# Patient Record
Sex: Male | Born: 1980 | Race: White | Hispanic: No | Marital: Married | Smoking: Never smoker
Health system: Southern US, Community
[De-identification: ages and names within clinical notes are randomized; demographics above are authoritative.]

## PROBLEM LIST (undated history)

## (undated) DIAGNOSIS — IMO0001 Reserved for inherently not codable concepts without codable children: Secondary | ICD-10-CM

## (undated) DIAGNOSIS — R030 Elevated blood-pressure reading, without diagnosis of hypertension: Secondary | ICD-10-CM

## (undated) HISTORY — DX: Reserved for inherently not codable concepts without codable children: IMO0001

## (undated) HISTORY — DX: Elevated blood-pressure reading, without diagnosis of hypertension: R03.0

---
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
## 2006-09-23 DIAGNOSIS — K3189 Other diseases of stomach and duodenum: Secondary | ICD-10-CM | POA: Insufficient documentation

## 2007-08-26 ENCOUNTER — Ambulatory Visit: Payer: Self-pay | Admitting: Otolaryngology

## 2008-01-18 DIAGNOSIS — IMO0002 Reserved for concepts with insufficient information to code with codable children: Secondary | ICD-10-CM | POA: Insufficient documentation

## 2009-11-29 IMAGING — RF DG BARIUM SWALLOW
1 series · 15 of 24 positions shown · non-contrast
Comparison: None

REASON FOR EXAM: with tablet dysphagia with solids
COMMENTS:

PROCEDURE:     FL  - FL BARIUM SWALLOW  - August 26, 2007  [DATE]
RESULT:     Barium swallow study
INDICATION: 26-year-old male with dysphagia
TECHNIQUE: Multiple single and double-contrast images of the esophagus and
stomach were obtained under fluoroscopic guidance.

[Series 1: run · 15 of 25 slices shown]
[im 1/25]
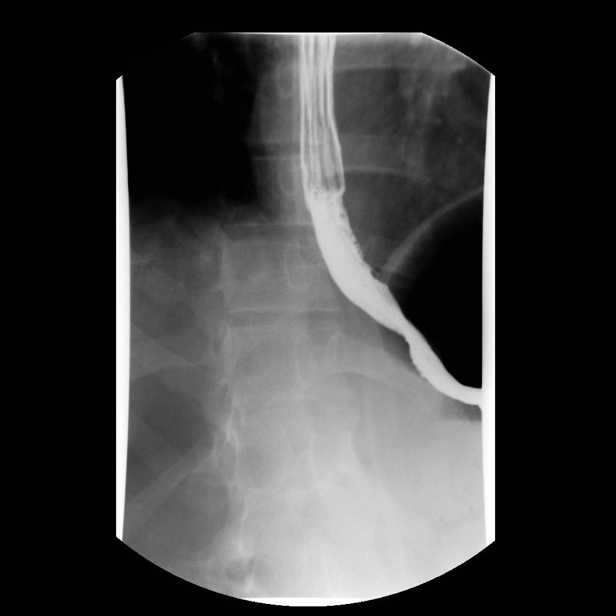
[im 3/25]
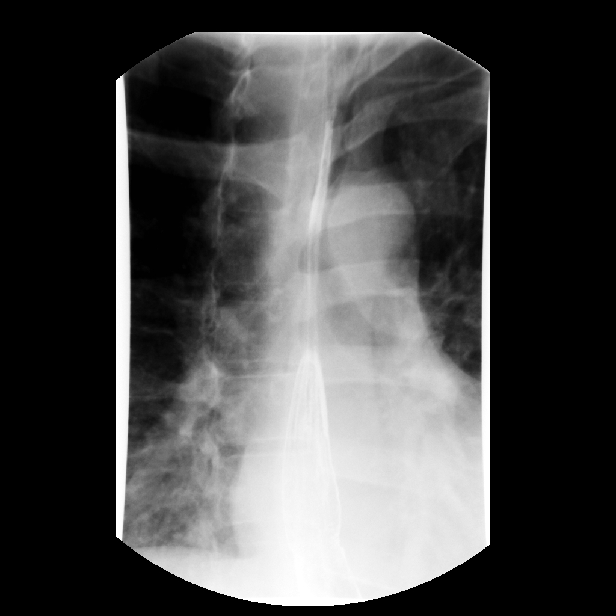
[im 5/25]
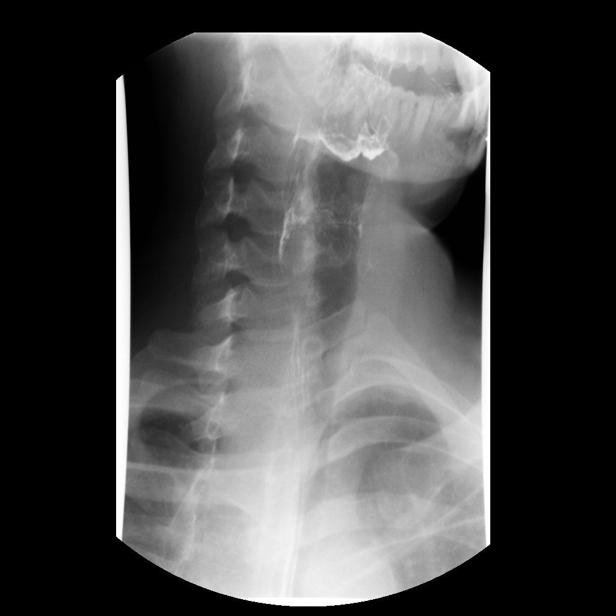
[im 6/25]
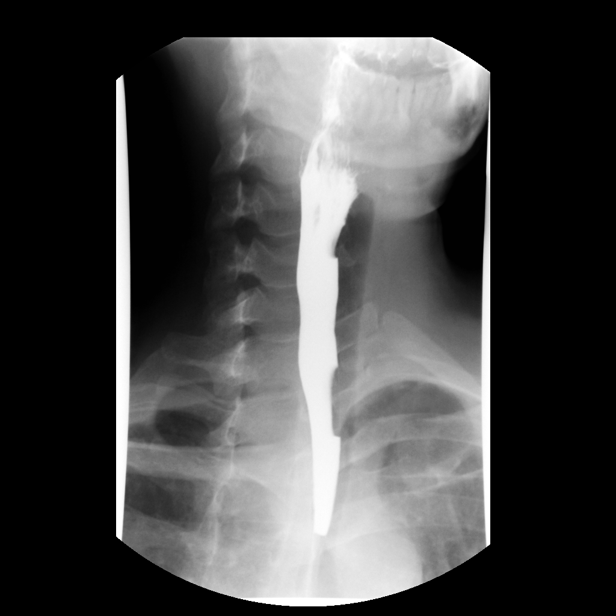
[im 8/25]
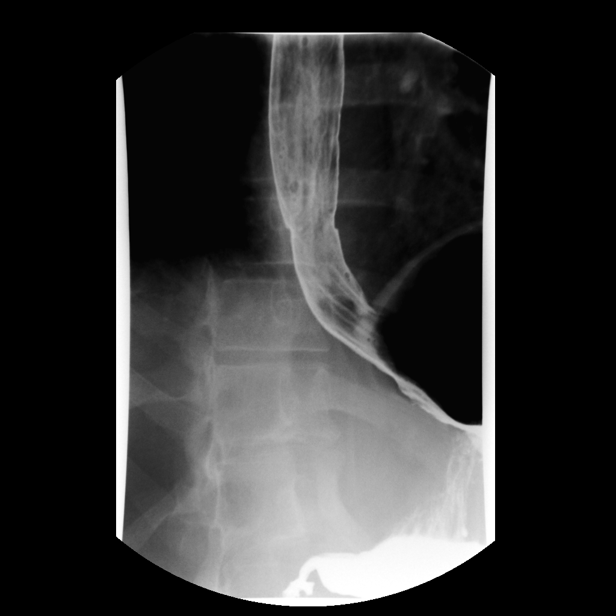
[im 9/25]
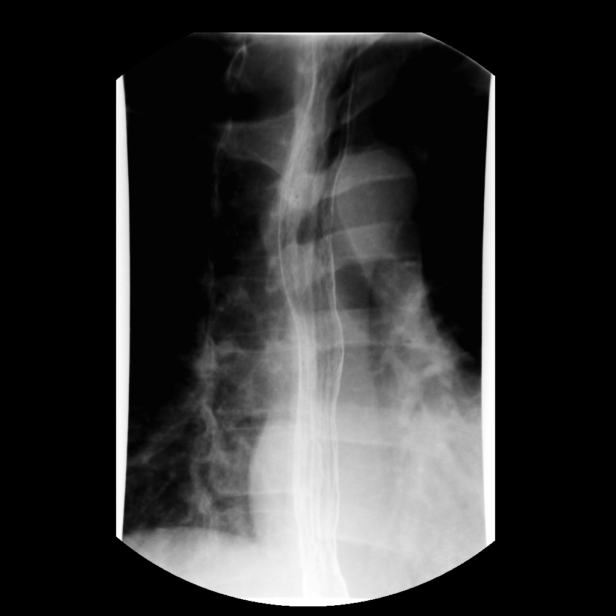
[im 11/25]
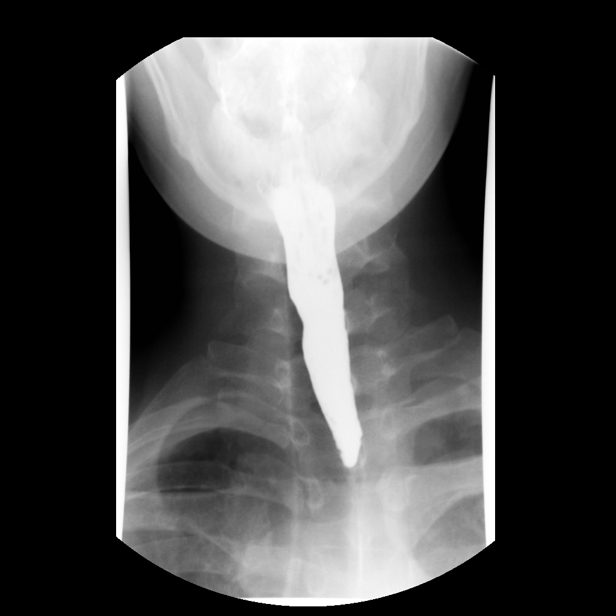
[im 13/25]
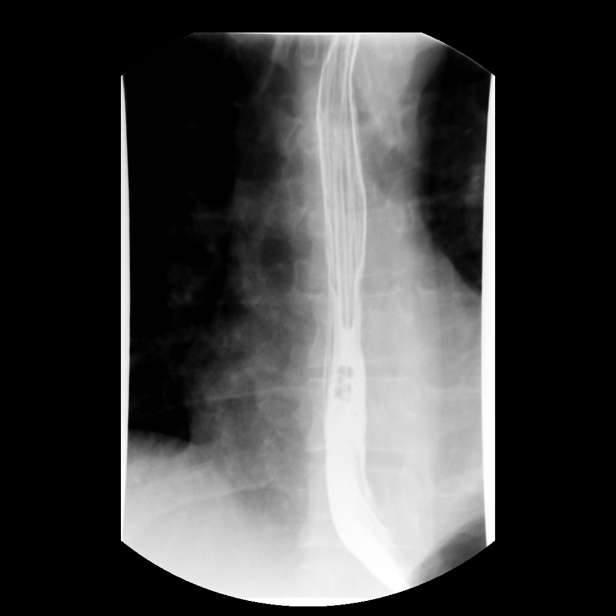
[im 14/25]
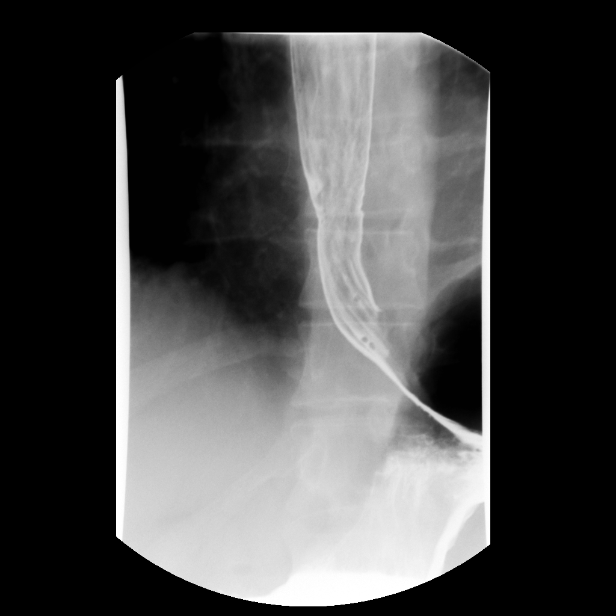
[im 16/25]
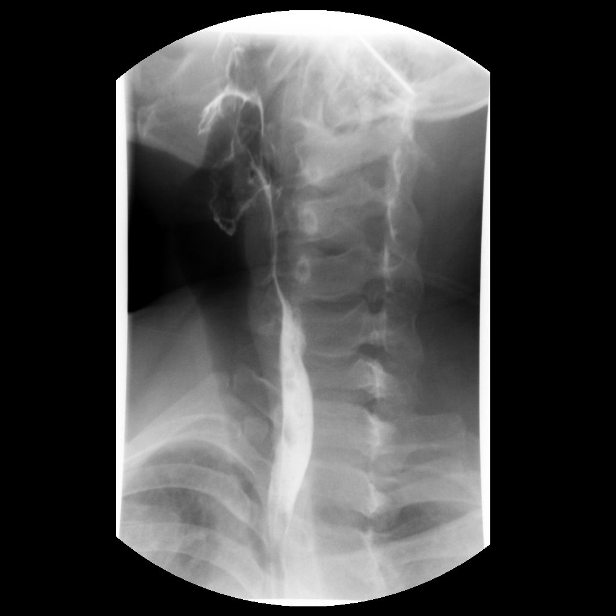
[im 17/25]
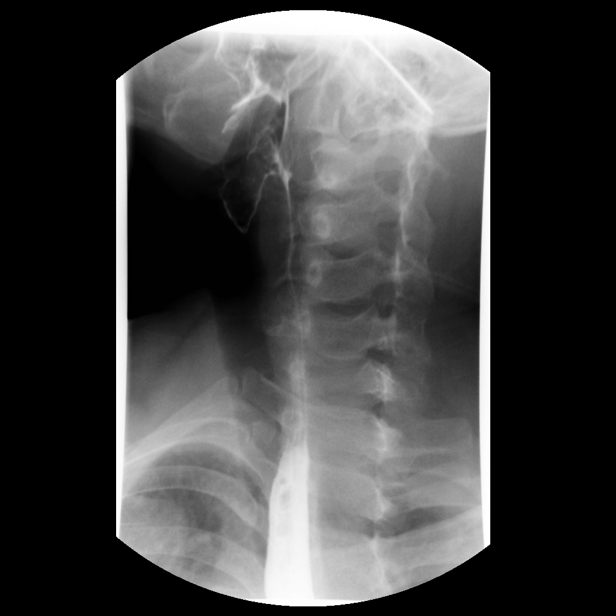
[im 19/25]
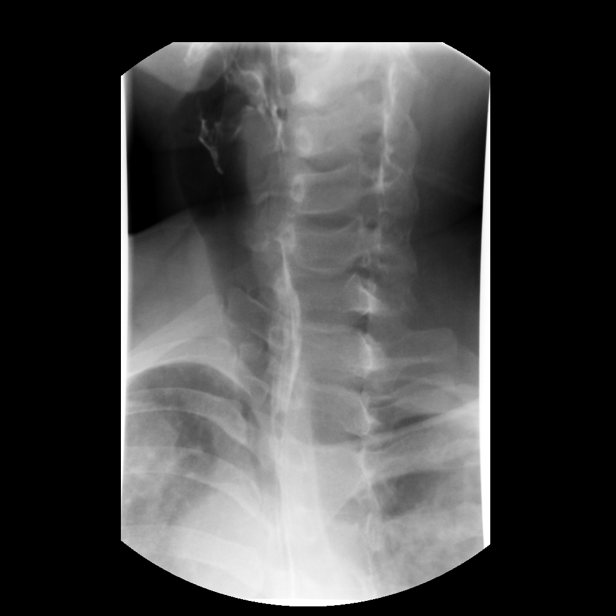
[im 21/25]
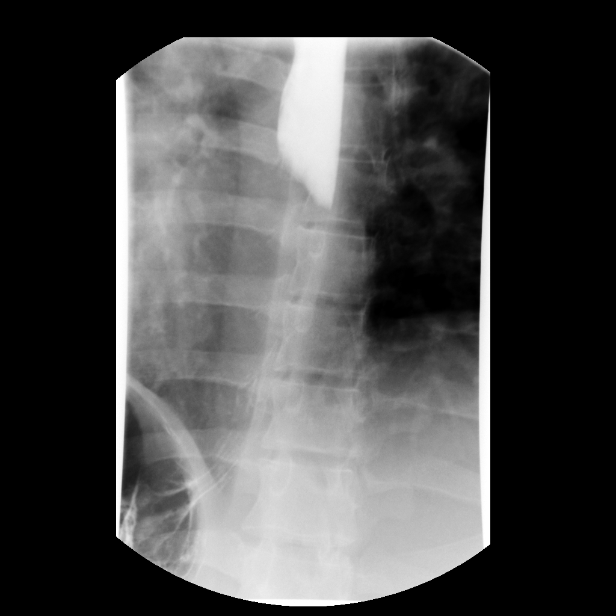
[im 22/25]
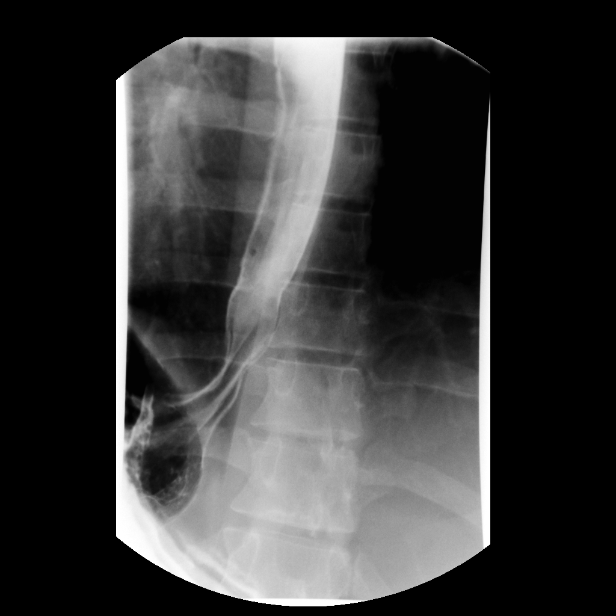
[im 25/25]
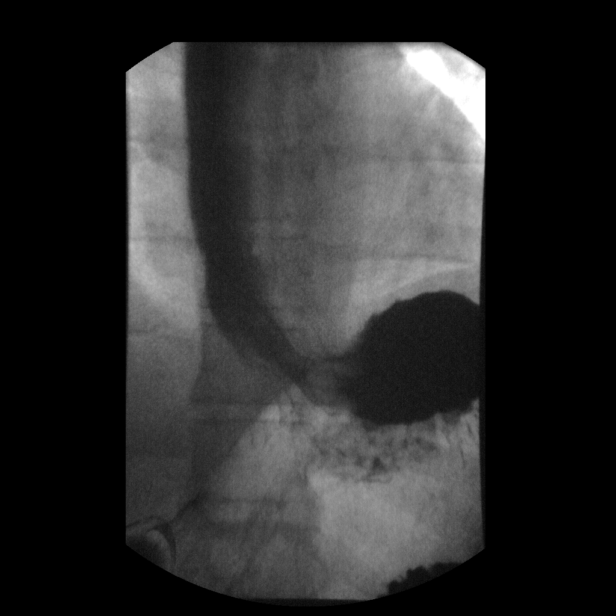

[15 of 24 positions shown; findings below may reference images not displayed]

FINDINGS: There was normal pharyngeal anatomy and motility. Contrast flowed
freely through the esophagus without evidence of stricture or mass. There
was normal esophageal mucosa without evidence of irregularity or ulceration.
 Esophageal motility was normal with evidence of gastroesophageal reflux to
the midesophagus. No definite hiatal hernia was demonstrated. The stomach
and duodenal bulb were normal.

A 12.5 mm barium tablet was administered which traversed the esophagus into
the stomach without delay.
IMPRESSION: 1. Normal esophageal motility with spontaneous gastroesophageal reflux to
the level of the midesophagus.

I have reviewed the films and concur with the above findings

## 2012-02-19 HISTORY — PX: VASECTOMY: SHX75

## 2013-10-01 ENCOUNTER — Ambulatory Visit: Payer: Self-pay | Admitting: Family Medicine

## 2014-12-23 ENCOUNTER — Ambulatory Visit (INDEPENDENT_AMBULATORY_CARE_PROVIDER_SITE_OTHER): Payer: BLUE CROSS/BLUE SHIELD | Admitting: Family Medicine

## 2014-12-23 ENCOUNTER — Encounter: Payer: Self-pay | Admitting: Family Medicine

## 2014-12-23 VITALS — BP 130/98 | HR 80 | Temp 97.8°F | Resp 16 | Ht 73.0 in | Wt 218.6 lb

## 2014-12-23 DIAGNOSIS — I10 Essential (primary) hypertension: Secondary | ICD-10-CM | POA: Diagnosis not present

## 2014-12-23 DIAGNOSIS — J302 Other seasonal allergic rhinitis: Secondary | ICD-10-CM | POA: Insufficient documentation

## 2014-12-23 DIAGNOSIS — Z23 Encounter for immunization: Secondary | ICD-10-CM | POA: Diagnosis not present

## 2014-12-23 DIAGNOSIS — R519 Headache, unspecified: Secondary | ICD-10-CM | POA: Insufficient documentation

## 2014-12-23 DIAGNOSIS — R51 Headache: Secondary | ICD-10-CM

## 2014-12-23 MED ORDER — METOPROLOL SUCCINATE ER 25 MG PO TB24: 25.0000 mg | ORAL_TABLET | Freq: Every day | ORAL | Status: DC

## 2014-12-23 NOTE — Progress Notes (Signed)
Patient: Matthew York Male    DOB: Jan 31, 1981   34 y.o.   MRN: [id number] Visit Date: 12/23/2014  Today's Provider: Vernie Murders, PA   Chief Complaint  Patient presents with  . Hypertension   Subjective:    HPI Hypertension: Patient is here for evaluation of elevated blood pressure. Last office visit was at 134/98 on 06/10/14.Patient last office visit for elevated blood pressure was on 10/01/13 and was asked to continue on Metoprolol. Patient was put on Metoprolol Succinate ER 25 mg on 12/01/12 Per patient doesn't remember the last time he took the medicine.Cardiac symptoms none.Cardiovascular risk factors: none. Use of agents associated with hypertension: none. History of target organ damage: none. Patient went today for evaluation to the [facility] to get a stress test and was told to come to his PCP for evaluation on his blood pressure. Blood Pressure reading was 144/80 before the test and after the test was 150/100.  Patient Active Problem List   Diagnosis Date Noted  . Allergic rhinitis, seasonal 12/23/2014  . Headache disorder 12/23/2014  . Sprain and strain of shoulder and upper arm 01/18/2008  . 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 Vasectomy  2014   History reviewed. No pertinent family history.  No Known Allergies Previous Medications   No medications on file   Review of Systems  Constitutional: Negative.   HENT: Negative.   Eyes: Negative.   Cardiovascular: Negative for chest pain, palpitations and leg swelling.  Gastrointestinal: Negative.   Genitourinary: Negative.   Neurological: Negative.    Social History  Substance Use Topics  . Smoking status: Never Smoker   . Smokeless tobacco: Never Used  . Alcohol Use: Yes     Comment: Occasional use   Objective:   BP 130/98 mmHg  Pulse 80  Temp(Src) 97.8 F (36.6 C) (Oral)  Resp 16  Wt 218 lb 9.6 oz (99.156 kg)  Physical  Exam  Constitutional: He is oriented to person, place, and time. He appears well-developed and well-nourished.  HENT:  Head: Normocephalic and atraumatic.  Right Ear: External ear normal.  Left Ear: External ear normal.  Nose: Nose normal.  Mouth/Throat: Oropharynx is clear and moist.  Eyes: Conjunctivae and EOM are normal.  Neck: Normal range of motion. Neck supple.  Cardiovascular: Normal rate, regular rhythm and normal heart sounds.   Pulmonary/Chest: Effort normal and breath sounds normal.  Abdominal: Soft. Bowel sounds are normal.  Musculoskeletal: Normal range of motion.  Neurological: He is alert and oriented to person, place, and time.  Skin: No rash noted.  Psychiatric: He has a normal mood and affect. His behavior is normal. Thought content normal.      Assessment & Plan:     1. Essential hypertension Recently recognized hypertension not controlled. Was on Metoprolol in 2014 but stopped taking the medications. Recheck evaluation by a health screening, in preparation for an exercise program, found BP elevated above 144/100. Will get routine labs and restart the Metoprolol Succinate 25 mg qd. Recommend sodium restricted low fat diet and proceed with walking for exercise until BP better controlled. Recheck BP in 1 month (sooner pending lab reports). - COMPLETE METABOLIC PANEL WITH GFR - CBC with Differential/Platelet - Lipid panel - TSH - metoprolol succinate (TOPROL-XL) 25 MG 24 hr tablet; Take 1 tablet (25 mg total) by mouth daily.  Dispense: 30 tablet; Refill: 3  2. Need for influenza vaccination -  Flu Vaccine QUAD 36+ mos IM       Vernie Murders, PA  [facility]

## 2014-12-23 NOTE — Patient Instructions (Signed)
Hypertension Hypertension, commonly called high blood pressure, is when the force of blood pumping through your arteries is too strong. Your arteries are the blood vessels that carry blood from your heart throughout your body. A blood pressure reading consists of a higher number over a lower number, such as 110/72. The higher number (systolic) is the pressure inside your arteries when your heart pumps. The lower number (diastolic) is the pressure inside your arteries when your heart relaxes. Ideally you want your blood pressure below 120/80. Hypertension forces your heart to work harder to pump blood. Your arteries may become narrow or stiff. Having untreated or uncontrolled hypertension can cause heart attack, stroke, kidney disease, and other problems. RISK FACTORS Some risk factors for high blood pressure are controllable. Others are not.  Risk factors you cannot control include:   Race. You may be at higher risk if you are African American.  Age. Risk increases with age.  Gender. Men are at higher risk than women before age 45 years. After age 65, women are at higher risk than men. Risk factors you can control include:  Not getting enough exercise or physical activity.  Being overweight.  Getting too much fat, sugar, calories, or salt in your diet.  Drinking too much alcohol. SIGNS AND SYMPTOMS Hypertension does not usually cause signs or symptoms. Extremely high blood pressure (hypertensive crisis) may cause headache, anxiety, shortness of breath, and nosebleed. DIAGNOSIS To check if you have hypertension, your health care provider will measure your blood pressure while you are seated, with your arm held at the level of your heart. It should be measured at least twice using the same arm. Certain conditions can cause a difference in blood pressure between your right and left arms. A blood pressure reading that is higher than normal on one occasion does not mean that you need treatment. If  it is not clear whether you have high blood pressure, you may be asked to return on a different day to have your blood pressure checked again. Or, you may be asked to monitor your blood pressure at home for 1 or more weeks. TREATMENT Treating high blood pressure includes making lifestyle changes and possibly taking medicine. Living a healthy lifestyle can help lower high blood pressure. You may need to change some of your habits. Lifestyle changes may include:  Following the DASH diet. This diet is high in fruits, vegetables, and whole grains. It is low in salt, red meat, and added sugars.  Keep your sodium intake below 2,300 mg per day.  Getting at least 30-45 minutes of aerobic exercise at least 4 times per week.  Losing weight if necessary.  Not smoking.  Limiting alcoholic beverages.  Learning ways to reduce stress. Your health care provider may prescribe medicine if lifestyle changes are not enough to get your blood pressure under control, and if one of the following is true:  You are 18-59 years of age and your systolic blood pressure is above 140.  You are 60 years of age or older, and your systolic blood pressure is above 150.  Your diastolic blood pressure is above 90.  You have diabetes, and your systolic blood pressure is over 140 or your diastolic blood pressure is over 90.  You have kidney disease and your blood pressure is above 140/90.  You have heart disease and your blood pressure is above 140/90. Your personal target blood pressure may vary depending on your medical conditions, your age, and other factors. HOME CARE INSTRUCTIONS    Have your blood pressure rechecked as directed by your health care provider.   Take medicines only as directed by your health care provider. Follow the directions carefully. Blood pressure medicines must be taken as prescribed. The medicine does not work as well when you skip doses. Skipping doses also puts you at risk for  problems.  Do not smoke.   Monitor your blood pressure at home as directed by your health care provider. SEEK MEDICAL CARE IF:   You think you are having a reaction to medicines taken.  You have recurrent headaches or feel dizzy.  You have swelling in your ankles.  You have trouble with your vision. SEEK IMMEDIATE MEDICAL CARE IF:  You develop a severe headache or confusion.  You have unusual weakness, numbness, or feel faint.  You have severe chest or abdominal pain.  You vomit repeatedly.  You have trouble breathing. MAKE SURE YOU:   Understand these instructions.  Will watch your condition.  Will get help right away if you are not doing well or get worse.   This information is not intended to replace advice given to you by your health care provider. Make sure you discuss any questions you have with your health care provider.   Document Released: 02/04/2005 Document Revised: 06/21/2014 Document Reviewed: 11/27/2012 Elsevier Interactive Patient Education 2016 [employer]  

## 2015-01-20 ENCOUNTER — Ambulatory Visit (INDEPENDENT_AMBULATORY_CARE_PROVIDER_SITE_OTHER): Payer: BLUE CROSS/BLUE SHIELD | Admitting: Family Medicine

## 2015-01-20 ENCOUNTER — Encounter: Payer: Self-pay | Admitting: Family Medicine

## 2015-01-20 VITALS — BP 122/84 | HR 82 | Temp 98.1°F | Resp 16 | Wt 221.0 lb

## 2015-01-20 DIAGNOSIS — I10 Essential (primary) hypertension: Secondary | ICD-10-CM

## 2015-01-20 NOTE — Progress Notes (Signed)
Patient ID: Matthew York, male   DOB: 11-14-1980, 34 y.o.   MRN: [id number]       Patient: Matthew York Male    DOB: 1980-07-26   34 y.o.   MRN: [id number] Visit Date: 01/20/2015  Today's Provider: Vernie Murders, PA   Chief Complaint  Patient presents with  . Hypertension   Subjective:    HPI  Hypertension, follow-up:  BP Readings from Last 3 Encounters:  01/20/15 122/84  12/23/14 130/98  06/10/14 134/98    He was last seen for hypertension 3 weeks ago.  BP at that visit was 130/98. Management since that visit includes restarted Metroprolol. He reports good compliance with treatment. He is not having side effects.  He is exercising. He is adherent to low salt diet.   Outside blood pressures are 140's/90's. Calibrated to his cuff today. Manually BP was 122/84 and at the same time with his cuff it was 143/98 Patient denies chest pain, chest pressure/discomfort, dyspnea, fatigue, irregular heart beat and palpitations.    Wt Readings from Last 3 Encounters:  01/20/15 221 lb (100.245 kg)  12/23/14 218 lb 9.6 oz (99.156 kg)  06/10/14 220 lb (99.791 kg)     ------------------------------------------------------------------------  Patient Active Problem List   Diagnosis Date Noted  . Allergic rhinitis, seasonal 12/23/2014  . Headache disorder 12/23/2014  . Sprain and strain of shoulder and upper arm 01/18/2008  . 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    Review of Systems  Constitutional: Negative.   HENT: Negative.   Eyes: Negative.   Respiratory: Negative.   Cardiovascular: Negative.   Gastrointestinal: Negative.   Endocrine: Negative.   Genitourinary: Negative.   Musculoskeletal: Negative.   Skin: Negative.   Allergic/Immunologic: Negative.   Neurological: Negative.   Hematological:  Negative.   Psychiatric/Behavioral: Negative.     Social History  Substance Use Topics  . Smoking status: Never Smoker   . Smokeless tobacco: Never Used  . Alcohol Use: Yes     Comment: Occasional use   Objective:   BP 122/84 mmHg  Pulse 82  Temp(Src) 98.1 F (36.7 C) (Oral)  Resp 16  Wt 221 lb (100.245 kg) BP Readings from Last 3 Encounters:  01/20/15 122/84  12/23/14 130/98  06/10/14 134/98     Physical Exam  Constitutional: He is oriented to person, place, and time. He appears well-developed.  HENT:  Head: Normocephalic.  Eyes: Conjunctivae are normal.  Neck: Neck supple.  Cardiovascular: Normal rate and regular rhythm.   Pulmonary/Chest: Effort normal and breath sounds normal.  Musculoskeletal: He exhibits no edema.  Neurological: He is alert and oriented to person, place, and time.      Assessment & Plan:     1. Essential hypertension Well controlled and tolerating Metoprolol without side effects. Continue present dosage and recheck in 2 months.       Vernie Murders, PA  [facility]

## 2015-03-23 ENCOUNTER — Telehealth: Payer: Self-pay | Admitting: Family Medicine

## 2015-03-23 DIAGNOSIS — I10 Essential (primary) hypertension: Secondary | ICD-10-CM

## 2015-03-23 MED ORDER — METOPROLOL SUCCINATE ER 25 MG PO TB24: 25.0000 mg | ORAL_TABLET | Freq: Every day | ORAL | Status: DC

## 2015-03-23 NOTE — Telephone Encounter (Signed)
Metoprolol refilled. Has follow up appointment scheduled for 03-31-15.

## 2015-03-23 NOTE — Telephone Encounter (Signed)
Pt contacted office for refill request on the following medications:  metoprolol succinate (TOPROL-XL) 25 MG 24 hr tablet.  [facility].  [id number]

## 2015-03-24 ENCOUNTER — Ambulatory Visit: Payer: BLUE CROSS/BLUE SHIELD | Admitting: Family Medicine

## 2015-03-31 ENCOUNTER — Ambulatory Visit (INDEPENDENT_AMBULATORY_CARE_PROVIDER_SITE_OTHER): Payer: BLUE CROSS/BLUE SHIELD | Admitting: Family Medicine

## 2015-03-31 ENCOUNTER — Encounter: Payer: Self-pay | Admitting: Family Medicine

## 2015-03-31 VITALS — BP 128/90 | HR 72 | Temp 98.6°F | Resp 14 | Wt 222.0 lb

## 2015-03-31 DIAGNOSIS — I10 Essential (primary) hypertension: Secondary | ICD-10-CM

## 2015-03-31 MED ORDER — METOPROLOL SUCCINATE ER 50 MG PO TB24
50.0000 mg | ORAL_TABLET | Freq: Every day | ORAL | Status: DC
Start: 2015-03-31 — End: 2015-08-23

## 2015-03-31 NOTE — Progress Notes (Signed)
Patient ID: Matthew York, male   DOB: 02/24/1980, 35 y.o.   MRN: [id number]   Patient: Matthew York Male    DOB: [birth year]   35 y.o.   MRN: [id number] Visit Date: 03/31/2015  Today's Provider: Vernie Murders, PA   Chief Complaint  Patient presents with  . Hypertension  . Follow-up   Subjective:    HPI  Hypertension, follow-up:  BP Readings from Last 3 Encounters:  03/31/15 128/90  01/20/15 122/84  12/23/14 130/98    He was last seen for hypertension 2 months ago.  BP at that visit was 122/84. Management changes since that visit include none. He reports excellent compliance with treatment. He is not having side effects.  He is not exercising. He is adherent to low salt diet.   Outside blood pressures are not being checked often. He is experiencing none.  Patient denies none.   Cardiovascular risk factors include male gender.  Use of agents associated with hypertension: none.     Weight trend: stable Wt Readings from Last 3 Encounters:  03/31/15 222 lb (100.699 kg)  01/20/15 221 lb (100.245 kg)  12/23/14 218 lb 9.6 oz (99.156 kg)    Current diet: well balanced  ------------------------------------------------------------------------  Patient Active Problem List   Diagnosis Date Noted  . Allergic rhinitis, seasonal 12/23/2014  . Headache disorder 12/23/2014  . Sprain and strain of shoulder and upper arm 01/18/2008  . 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 Vasectomy  2014   History reviewed. No pertinent family history.  Previous Medications   METOPROLOL SUCCINATE (TOPROL-XL) 25 MG 24 HR TABLET    Take 1 tablet (25 mg total) by mouth daily.   No Known Allergies  Review of Systems  Constitutional: Negative.   HENT: Negative.   Eyes: Negative.   Respiratory: Negative.   Cardiovascular: Negative.   Gastrointestinal: Negative.   Endocrine: Negative.   Genitourinary: Negative.     Musculoskeletal: Negative.   Skin: Negative.   Allergic/Immunologic: Negative.   Neurological: Negative.   Hematological: Negative.   Psychiatric/Behavioral: Negative.     Social History  Substance Use Topics  . Smoking status: Never Smoker   . Smokeless tobacco: Never Used  . Alcohol Use: Yes     Comment: Occasional use   Objective:   BP 128/90 mmHg  Pulse 72  Temp(Src) 98.6 F (37 C) (Oral)  Resp 14  Wt 222 lb (100.699 kg)  Physical Exam  Constitutional: He is oriented to person, place, and time. He appears well-developed and well-nourished. No distress.  HENT:  Head: Normocephalic and atraumatic.  Right Ear: Hearing normal.  Left Ear: Hearing normal.  Nose: Nose normal.  Eyes: Conjunctivae and lids are normal. Right eye exhibits no discharge. Left eye exhibits no discharge. No scleral icterus.  Neck: Neck supple.  Cardiovascular: Normal rate and regular rhythm.   Pulmonary/Chest: Effort normal and breath sounds normal. No respiratory distress.  Abdominal: Soft. Bowel sounds are normal.  Musculoskeletal: Normal range of motion.  Neurological: He is alert and oriented to person, place, and time.  Skin: Skin is intact. No lesion and no rash noted.  Psychiatric: He has a normal mood and affect. His speech is normal and behavior is normal. Thought content normal.      Assessment & Plan:      1. Essential hypertension Feeling well and tolerating Toprol without side effects. Will bring copy of labs done at work for review. BP still  has some elevation of diastolic pressure. Will increase Toprol-XL to 50 mg qd. Recheck in 3 months. - metoprolol succinate (TOPROL-XL) 50 MG 24 hr tablet; Take 1 tablet (50 mg total) by mouth daily.  Dispense: 30 tablet; Refill: 3

## 2015-06-30 ENCOUNTER — Ambulatory Visit (INDEPENDENT_AMBULATORY_CARE_PROVIDER_SITE_OTHER): Payer: BLUE CROSS/BLUE SHIELD | Admitting: Family Medicine

## 2015-06-30 ENCOUNTER — Encounter: Payer: Self-pay | Admitting: Family Medicine

## 2015-06-30 DIAGNOSIS — I10 Essential (primary) hypertension: Secondary | ICD-10-CM | POA: Diagnosis not present

## 2015-06-30 NOTE — Progress Notes (Signed)
Patient ID: Matthew York, male   DOB: [birth year], 35 y.o.   MRN: [id number]       Patient: Matthew York Male    DOB: 1980/04/14   35 y.o.   MRN: [id number] Visit Date: 06/30/2015  Today's Provider: Vernie Murders, PA   Chief Complaint  Patient presents with  . Hypertension    follow up    Subjective:    HPI  Hypertension, follow-up:  BP Readings from Last 3 Encounters:  03/31/15 128/90  01/20/15 122/84  12/23/14 130/98    He was last seen for hypertension 3 months ago.  BP at that visit was 128/90. Management since that visit includes increased Toprol to 50mg  daily. He reports good compliance with treatment. He is not having side effects.  He is not exercising. He is not adherent to low salt diet.   Outside blood pressures are not being checked. Patient denies chest pain, dyspnea, fatigue, irregular heart beat, palpitations, syncope and tachypnea.   Weight trend: stable Wt Readings from Last 3 Encounters:  03/31/15 222 lb (100.699 kg)  01/20/15 221 lb (100.245 kg)  12/23/14 218 lb 9.6 oz (99.156 kg)    Current diet: well balanced  ------------------------------------------------------------------------  Past Medical History  Diagnosis Date  . Elevated blood pressure    Past Surgical History  Procedure Laterality Date  . Vasectomy  2014   No family history on file.  No Known Allergies Previous Medications   METOPROLOL SUCCINATE (TOPROL-XL) 50 MG 24 HR TABLET    Take 1 tablet (50 mg total) by mouth daily.    Review of Systems  Constitutional: Negative.   Respiratory: Negative.   Cardiovascular: Negative.   Neurological: Negative.     Social History  Substance Use Topics  . Smoking status: Never Smoker   . Smokeless tobacco: Never Used  . Alcohol Use: Yes     Comment: Occasional use   Objective:   There were no vitals taken for this visit.  Physical Exam  Constitutional: He is oriented to person, place, and time. He appears well-developed and  well-nourished. No distress.  HENT:  Head: Normocephalic and atraumatic.  Right Ear: Hearing normal.  Left Ear: Hearing normal.  Nose: Nose normal.  Eyes: Conjunctivae and lids are normal. Right eye exhibits no discharge. Left eye exhibits no discharge. No scleral icterus.  Neck: Neck supple.  Cardiovascular: Normal rate and regular rhythm.   Pulmonary/Chest: Effort normal and breath sounds normal. No respiratory distress.  Abdominal: Soft. Bowel sounds are normal.  Musculoskeletal: Normal range of motion.  Neurological: He is alert and oriented to person, place, and time.  Skin: Skin is intact. No lesion and no rash noted.  Psychiatric: He has a normal mood and affect. His speech is normal and behavior is normal. Thought content normal.      Assessment & Plan:     1. Essential hypertension Stable and well controlled BP. Feeling well without side effects. Tolerating Toprol-XL 50 mg qd. Encouraged to drink fluids and limit exposure to summer heat. Forgot to get labs done 6 months ago. Will place future orders to be available for another try. Recheck BP in 3 months. - CBC with Differential/Platelet; Future - Comprehensive metabolic panel; Future - TSH; Future        Vernie Murders, PA  [facility]

## 2015-08-23 ENCOUNTER — Other Ambulatory Visit: Payer: Self-pay | Admitting: Family Medicine

## 2015-08-28 ENCOUNTER — Telehealth: Payer: Self-pay | Admitting: Family Medicine

## 2015-08-28 NOTE — Telephone Encounter (Signed)
Sure. Just print out future order on labs.

## 2015-08-28 NOTE — Telephone Encounter (Signed)
Ok to fax lab order to patient?

## 2015-08-28 NOTE — Telephone Encounter (Signed)
Pt would like the lab order that was placed as future orders on 06/30/15 faxed to his nurse dept at work so that he can have the labs done at no cost to him. Fax# [phone] Atten: Glorious Peach. Thanks TNP

## 2015-08-29 ENCOUNTER — Other Ambulatory Visit: Payer: Self-pay

## 2015-08-29 DIAGNOSIS — I10 Essential (primary) hypertension: Secondary | ICD-10-CM

## 2015-08-29 NOTE — Telephone Encounter (Signed)
Labs printed and faxed to number listed below.

## 2015-09-11 LAB — CBC AND DIFFERENTIAL
BASOS PCT: 0.8
Basophils Absolute: 51 /uL
EOS ABS: 141 /uL
Eosinophil %: 2.2
HCT: 49 % (ref 41–53)
Hemoglobin: 16.3 g/dL (ref 13.5–17.5)
LYMPHO ABS: 2592 /uL
Lymphocytes relative %: 40.5 % (ref 15–45)
MCH: 28.5 pg (ref 26.5–32.5)
MCHC (KUC): 33.4 g/dL (ref 32.5–36.9)
MCV (KUC): 85.5 fL (ref 80–98)
MONOCYTES RELATIVE % (KUC): 6.7 % (ref 2–10)
MONOCYTES(ABSOLUTE): 429
MPV: 11 fL (ref 7.8–11)
Neutrophils Absolute: 3187 /uL
Neutrophils relative % (GR): 49.8 % (ref 44–76)
Platelets: 239 10*3/uL (ref 150–399)
RBC (KUC): 5.71 MIL/uL (ref 4.2–5.8)
RDW: 13.3 % (ref 11.6–14)
WBC: 6.4 10*3/mL

## 2015-09-11 LAB — COMPREHENSIVE METABOLIC PANEL
ALBUMIN/GLOB SERPL: 1.7
ALBUMIN: 4.3
Calcium, Ser: 9.2
Carbon Dioxide, Total: 24
Chloride, Serum: 103
GFR CALC AF AMER: 106
GFR CALC NON AF AMER: 91
Globulin: 2.6
Total Protein: 6.9 g/dL

## 2015-09-11 LAB — LIPID PANEL: NONHDL CHOLESTEROL: 134

## 2015-09-21 ENCOUNTER — Encounter: Payer: Self-pay | Admitting: *Deleted

## 2015-10-02 ENCOUNTER — Encounter: Payer: Self-pay | Admitting: Family Medicine

## 2015-10-02 ENCOUNTER — Ambulatory Visit (INDEPENDENT_AMBULATORY_CARE_PROVIDER_SITE_OTHER): Payer: BLUE CROSS/BLUE SHIELD | Admitting: Family Medicine

## 2015-10-02 DIAGNOSIS — I10 Essential (primary) hypertension: Secondary | ICD-10-CM | POA: Insufficient documentation

## 2015-10-02 MED ORDER — METOPROLOL SUCCINATE ER 50 MG PO TB24: ORAL_TABLET | ORAL | 1 refills | Status: DC

## 2015-10-02 NOTE — Patient Instructions (Signed)
Hypertension Hypertension, commonly called high blood pressure, is when the force of blood pumping through your arteries is too strong. Your arteries are the blood vessels that carry blood from your heart throughout your body. A blood pressure reading consists of a higher number over a lower number, such as 110/72. The higher number (systolic) is the pressure inside your arteries when your heart pumps. The lower number (diastolic) is the pressure inside your arteries when your heart relaxes. Ideally you want your blood pressure below 120/80. Hypertension forces your heart to work harder to pump blood. Your arteries may become narrow or stiff. Having untreated or uncontrolled hypertension can cause heart attack, stroke, kidney disease, and other problems. RISK FACTORS Some risk factors for high blood pressure are controllable. Others are not.  Risk factors you cannot control include:   Race. You may be at higher risk if you are African American.  Age. Risk increases with age.  Gender. Men are at higher risk than women before age 45 years. After age 65, women are at higher risk than men. Risk factors you can control include:  Not getting enough exercise or physical activity.  Being overweight.  Getting too much fat, sugar, calories, or salt in your diet.  Drinking too much alcohol. SIGNS AND SYMPTOMS Hypertension does not usually cause signs or symptoms. Extremely high blood pressure (hypertensive crisis) may cause headache, anxiety, shortness of breath, and nosebleed. DIAGNOSIS To check if you have hypertension, your health care provider will measure your blood pressure while you are seated, with your arm held at the level of your heart. It should be measured at least twice using the same arm. Certain conditions can cause a difference in blood pressure between your right and left arms. A blood pressure reading that is higher than normal on one occasion does not mean that you need treatment. If  it is not clear whether you have high blood pressure, you may be asked to return on a different day to have your blood pressure checked again. Or, you may be asked to monitor your blood pressure at home for 1 or more weeks. TREATMENT Treating high blood pressure includes making lifestyle changes and possibly taking medicine. Living a healthy lifestyle can help lower high blood pressure. You may need to change some of your habits. Lifestyle changes may include:  Following the DASH diet. This diet is high in fruits, vegetables, and whole grains. It is low in salt, red meat, and added sugars.  Keep your sodium intake below 2,300 mg per day.  Getting at least 30-45 minutes of aerobic exercise at least 4 times per week.  Losing weight if necessary.  Not smoking.  Limiting alcoholic beverages.  Learning ways to reduce stress. Your health care provider may prescribe medicine if lifestyle changes are not enough to get your blood pressure under control, and if one of the following is true:  You are 18-59 years of age and your systolic blood pressure is above 140.  You are 60 years of age or older, and your systolic blood pressure is above 150.  Your diastolic blood pressure is above 90.  You have diabetes, and your systolic blood pressure is over 140 or your diastolic blood pressure is over 90.  You have kidney disease and your blood pressure is above 140/90.  You have heart disease and your blood pressure is above 140/90. Your personal target blood pressure may vary depending on your medical conditions, your age, and other factors. HOME CARE INSTRUCTIONS    Have your blood pressure rechecked as directed by your health care provider.   Take medicines only as directed by your health care provider. Follow the directions carefully. Blood pressure medicines must be taken as prescribed. The medicine does not work as well when you skip doses. Skipping doses also puts you at risk for  problems.  Do not smoke.   Monitor your blood pressure at home as directed by your health care provider. SEEK MEDICAL CARE IF:   You think you are having a reaction to medicines taken.  You have recurrent headaches or feel dizzy.  You have swelling in your ankles.  You have trouble with your vision. SEEK IMMEDIATE MEDICAL CARE IF:  You develop a severe headache or confusion.  You have unusual weakness, numbness, or feel faint.  You have severe chest or abdominal pain.  You vomit repeatedly.  You have trouble breathing. MAKE SURE YOU:   Understand these instructions.  Will watch your condition.  Will get help right away if you are not doing well or get worse.   This information is not intended to replace advice given to you by your health care provider. Make sure you discuss any questions you have with your health care provider.   Document Released: 02/04/2005 Document Revised: 06/21/2014 Document Reviewed: 11/27/2012 Elsevier Interactive Patient Education 2016 [employer]  

## 2015-10-02 NOTE — Progress Notes (Signed)
Patient: Matthew York Male    DOB: [birth year]   35 y.o.   MRN: [id number] Visit Date: 10/02/2015  Today's Provider: Vernie Murders, PA   Chief Complaint  Patient presents with  . Hypertension   Subjective:    HPI   Hypertension, follow-up:  BP Readings from Last 3 Encounters:  10/02/15 (!) 138/92  03/31/15 128/90  01/20/15 122/84    He was last seen for hypertension 3 months ago.  BP at that visit was 128/90. Management since that visit includes None.He reports excellent compliance with treatment. He is not having side effects.  He is exercising. He is adherent to low salt diet.   Outside blood pressures are Pt reports he is not checking is BP at home. He is experiencing none.  Patient denies chest pain, fatigue, irregular heart beat, lower extremity edema and palpitations.   Cardiovascular risk factors include none.   ------------------------------------------------------------------------ Patient Active Problem List   Diagnosis Date Noted  . Hypertension 10/02/2015  . Allergic rhinitis, seasonal 12/23/2014  . Headache disorder 12/23/2014  . Sprain and strain of shoulder and upper arm 01/18/2008  . 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 VASECTOMY  2014   Family History  Problem Relation Age of Onset  . Hypertension Father    No Known Allergies   Current Meds  Medication Sig  . metoprolol succinate (TOPROL-XL) 50 MG 24 hr tablet TAKE 1 TABLET (50 MG TOTAL) BY MOUTH DAILY.    Review of Systems  Constitutional: Negative.   Respiratory: Negative.   Cardiovascular: Negative.   Gastrointestinal: Negative.   Musculoskeletal: Negative.   Neurological: Negative for dizziness, tremors, weakness, light-headedness, numbness and headaches.    Social History  Substance Use Topics  . Smoking status: Never Smoker  . Smokeless tobacco: Never Used  . Alcohol use Yes     Comment:  Occasional use   Objective:   BP (!) 138/92 (BP Location: Right Arm, Patient Position: Sitting, Cuff Size: Large)   Pulse 68   Temp 98.8 F (37.1 C) (Oral)   Resp 16   Wt 226 lb (102.5 kg)   BMI 29.82 kg/m  Wt Readings from Last 3 Encounters:  10/02/15 226 lb (102.5 kg)  03/31/15 222 lb (100.7 kg)  01/20/15 221 lb (100.2 kg)    Physical Exam  Constitutional: He is oriented to person, place, and time. He appears well-developed and well-nourished. No distress.  HENT:  Head: Normocephalic and atraumatic.  Right Ear: Hearing normal.  Left Ear: Hearing normal.  Nose: Nose normal.  Eyes: Conjunctivae and lids are normal. Right eye exhibits no discharge. Left eye exhibits no discharge. No scleral icterus.  Neck: Neck supple.  Cardiovascular: Normal rate and regular rhythm.   Pulmonary/Chest: Effort normal and breath sounds normal. No respiratory distress.  Abdominal: Bowel sounds are normal.  Musculoskeletal: Normal range of motion.  Neurological: He is alert and oriented to person, place, and time.  Skin: Skin is intact. No lesion and no rash noted.  Psychiatric: He has a normal mood and affect. His speech is normal and behavior is normal. Thought content normal.      Assessment & Plan:     1. Essential hypertension Borderline BP today. Encouraged to restrict sodium and caffeine. Limit ETOH and continue Metoprolol daily. Will recheck BP and labs in 3-4 months. Encouraged to exercise and lose weight (has regained 4-5 lbs in the past year). -  metoprolol succinate (TOPROL-XL) 50 MG 24 hr tablet; TAKE 1 TABLET (50 MG TOTAL) BY MOUTH DAILY.  Dispense: 90 tablet; Refill: [place]

## 2015-10-06 ENCOUNTER — Encounter: Payer: Self-pay | Admitting: Family Medicine

## 2015-10-06 ENCOUNTER — Ambulatory Visit: Payer: BLUE CROSS/BLUE SHIELD | Admitting: Family Medicine

## 2016-01-02 ENCOUNTER — Ambulatory Visit: Payer: BLUE CROSS/BLUE SHIELD | Admitting: Family Medicine

## 2016-01-02 NOTE — Progress Notes (Deleted)
   Patient: Matthew York Male    DOB: 12-11-1980   35 y.o.   MRN: [id number] Visit Date: 01/02/2016  Today's Provider: Vernie Murders, PA   No chief complaint on file.  Subjective:    HPI  Hypertension, follow-up:  BP Readings from Last 3 Encounters:  10/02/15 (!) 138/92  03/31/15 128/90  01/20/15 122/84    He was last seen for hypertension 3 months ago.  BP at that visit was 138/92. Management changes since that visit include recommended patient restrict sodium and caffeine intake, Limit ETOH and continue Metoprolol daily, exercise and weight loss. He reports good compliance with treatment. He is not having side effects.  He {is/is not:9024} exercising. He {is/is not:9024} adherent to low salt diet.   Outside blood pressures are ***. He is experiencing none.  Patient denies chest pain, irregular heart beat and palpitations.   Cardiovascular risk factors include hypertension and male gender.  Use of agents associated with hypertension: none.     Weight trend: stable Wt Readings from Last 3 Encounters:  10/02/15 226 lb (102.5 kg)  03/31/15 222 lb (100.7 kg)  01/20/15 221 lb (100.2 kg)    Current diet: {diet habits:16563}  ------------------------------------------------------------------------    Previous Medications   METOPROLOL SUCCINATE (TOPROL-XL) 50 MG 24 HR TABLET    TAKE 1 TABLET (50 MG TOTAL) BY MOUTH DAILY.    Review of Systems  Constitutional: Negative.   Respiratory: Negative.   Cardiovascular: Negative.     Social History  Substance Use Topics  . Smoking status: Never Smoker  . Smokeless tobacco: Never Used  . Alcohol use Yes     Comment: Occasional use   Objective:   There were no vitals taken for this visit.  Physical Exam      Assessment & Plan:       Follow up: No Follow-up on file.

## 2016-01-05 IMAGING — CR DG CHEST 2V
1 series · 2 of 2 positions shown · non-contrast
Comparison: None.

CLINICAL DATA: Chest pain

EXAM:
CHEST  2 VIEW

[Series 1: kdxr chest pa (or ap) and lat · 0.14mm/px · 2 of 2 slices shown]
[im 1/2]
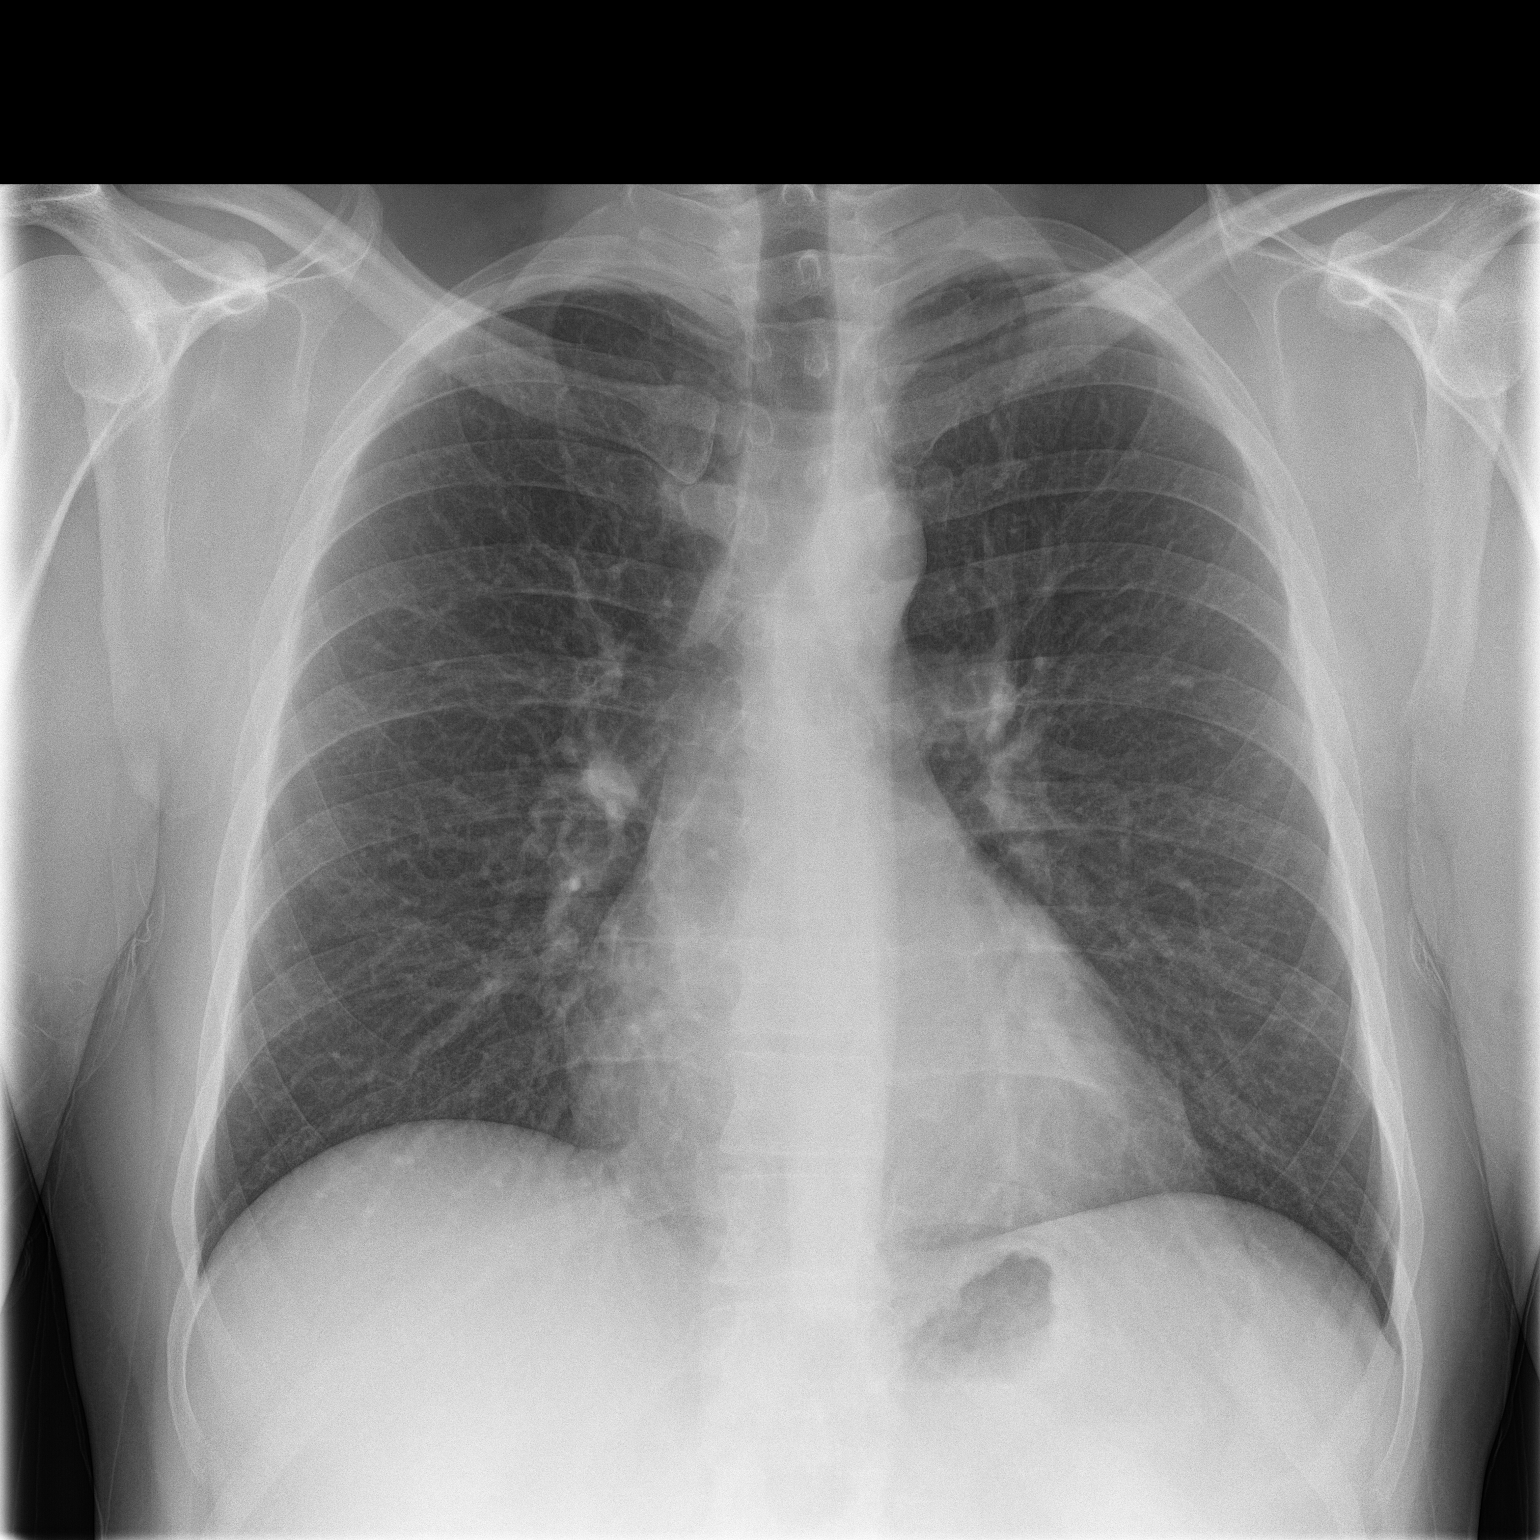
[im 2/2]
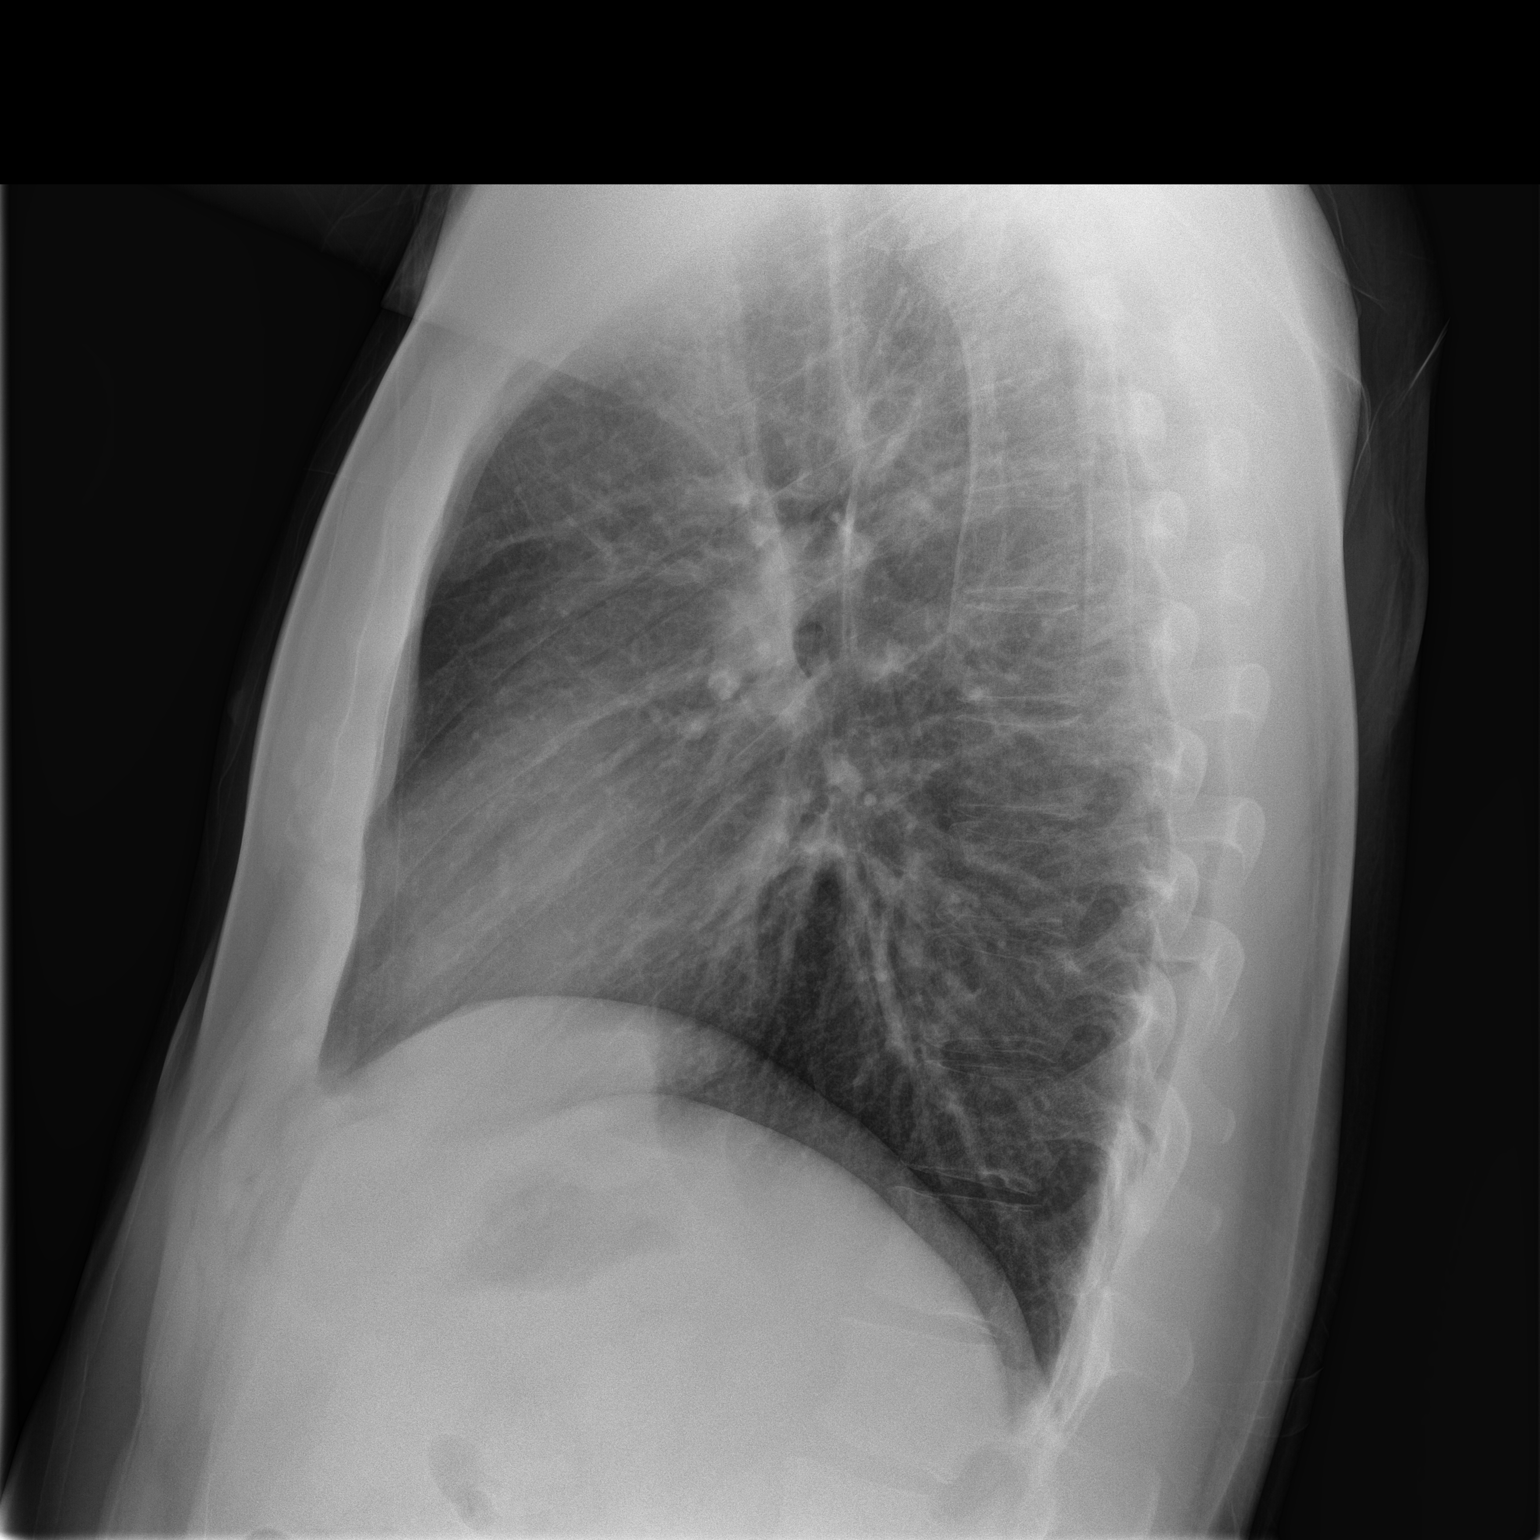

[2 of 2 positions shown; findings below may reference images not displayed]

FINDINGS: Cardiomediastinal silhouette is unremarkable. No acute infiltrate or
pleural effusion. No pulmonary edema. Bony thorax is unremarkable.
IMPRESSION: No active cardiopulmonary disease.

## 2016-06-08 ENCOUNTER — Other Ambulatory Visit: Payer: Self-pay | Admitting: Family Medicine

## 2016-06-08 DIAGNOSIS — I10 Essential (primary) hypertension: Secondary | ICD-10-CM

## 2016-07-11 DIAGNOSIS — B029 Zoster without complications: Secondary | ICD-10-CM | POA: Diagnosis not present

## 2016-07-11 DIAGNOSIS — I10 Essential (primary) hypertension: Secondary | ICD-10-CM | POA: Diagnosis not present

## 2016-11-06 DIAGNOSIS — I10 Essential (primary) hypertension: Secondary | ICD-10-CM | POA: Diagnosis not present

## 2017-01-30 DIAGNOSIS — R7989 Other specified abnormal findings of blood chemistry: Secondary | ICD-10-CM | POA: Diagnosis not present

## 2017-01-30 DIAGNOSIS — R17 Unspecified jaundice: Secondary | ICD-10-CM | POA: Diagnosis not present

## 2017-01-30 DIAGNOSIS — E781 Pure hyperglyceridemia: Secondary | ICD-10-CM | POA: Diagnosis not present

## 2017-02-14 DIAGNOSIS — I10 Essential (primary) hypertension: Secondary | ICD-10-CM | POA: Diagnosis not present

## 2017-02-14 DIAGNOSIS — R7989 Other specified abnormal findings of blood chemistry: Secondary | ICD-10-CM | POA: Diagnosis not present

## 2017-06-09 DIAGNOSIS — Z760 Encounter for issue of repeat prescription: Secondary | ICD-10-CM | POA: Diagnosis not present

## 2017-08-15 DIAGNOSIS — E781 Pure hyperglyceridemia: Secondary | ICD-10-CM | POA: Diagnosis not present

## 2017-08-15 DIAGNOSIS — R7989 Other specified abnormal findings of blood chemistry: Secondary | ICD-10-CM | POA: Diagnosis not present

## 2017-08-15 DIAGNOSIS — Z0000 Encounter for general adult medical examination without abnormal findings: Secondary | ICD-10-CM | POA: Diagnosis not present

## 2017-09-02 DIAGNOSIS — H43399 Other vitreous opacities, unspecified eye: Secondary | ICD-10-CM | POA: Diagnosis not present

## 2017-09-02 DIAGNOSIS — G43109 Migraine with aura, not intractable, without status migrainosus: Secondary | ICD-10-CM | POA: Diagnosis not present

## 2017-09-05 DIAGNOSIS — I10 Essential (primary) hypertension: Secondary | ICD-10-CM | POA: Diagnosis not present

## 2017-09-05 DIAGNOSIS — E039 Hypothyroidism, unspecified: Secondary | ICD-10-CM | POA: Diagnosis not present

## 2017-09-05 DIAGNOSIS — E663 Overweight: Secondary | ICD-10-CM | POA: Diagnosis not present

## 2017-09-05 DIAGNOSIS — E785 Hyperlipidemia, unspecified: Secondary | ICD-10-CM | POA: Diagnosis not present

## 2017-10-22 ENCOUNTER — Telehealth: Payer: Self-pay

## 2017-10-22 NOTE — Telephone Encounter (Signed)
Patient calling that he has been having periodically blood in the toilet and on the tissue paper since Saturday. Reports is not bright red is more like ting of blood. Reports he is on vacation and is coming back on until Friday. He reports that his stool is loose but when he has a bowel movement it doesn't feel comfortable when it passes through. Per patient he has had two bowel movements that had no blood in it. Advised patient that he is on vacation and is not in town that if it gets worse and develops abdominal pain or fever that he needs to go to an urgent care.  Thanks,  -Genni Buske

## 2017-10-23 NOTE — Telephone Encounter (Signed)
Could be a hemorrhoid, a fissure or irritation from loose stools. Need someone to look at this if still having blood on tissue or in stools. Agree he should go to an Urgent Care in his area.

## 2017-10-24 ENCOUNTER — Ambulatory Visit: Payer: BLUE CROSS/BLUE SHIELD | Admitting: Family Medicine

## 2017-10-24 ENCOUNTER — Encounter: Payer: Self-pay | Admitting: Family Medicine

## 2017-10-24 VITALS — BP 128/82 | HR 70 | Temp 98.3°F | Resp 16 | Ht 73.0 in | Wt 230.0 lb

## 2017-10-24 DIAGNOSIS — K921 Melena: Secondary | ICD-10-CM

## 2017-10-24 DIAGNOSIS — R194 Change in bowel habit: Secondary | ICD-10-CM | POA: Diagnosis not present

## 2017-10-24 NOTE — Progress Notes (Signed)
       Patient: Matthew York Male    DOB: 1981-02-12   37 y.o.   MRN: [id number] Visit Date: 10/24/2017  Today's Provider: Lelon Huh, MD   Chief Complaint  Patient presents with  . Blood In Stools   Subjective:    HPI Patient comes in today c/o blood in his stool within the last week. He reports that this was with every bowel movement. Has also noted that stool appear more flat the last several months, and occasional has to clean himself repeatedly after having a bowel movement. Last noticed blood 2 days ago. States he has occasionally stinging around rectum, but is not persistent.   He does not have any 1st degree relatives with colon cancer or colon polyps that he is aware of.      No Known Allergies   Current Outpatient Medications:  .  metoprolol succinate (TOPROL-XL) 50 MG 24 hr tablet, TAKE 1 TABLET (50 MG TOTAL) BY MOUTH DAILY., Disp: 90 tablet, Rfl: 1  Review of Systems  Constitutional: Negative for activity change, appetite change, chills, diaphoresis, fatigue, fever and unexpected weight change.  Gastrointestinal: Positive for anal bleeding, blood in stool and rectal pain. Negative for abdominal distention, abdominal pain, constipation, diarrhea, nausea and vomiting.    Social History   Tobacco Use  . Smoking status: Never Smoker  . Smokeless tobacco: Never Used  Substance Use Topics  . Alcohol use: Yes    Comment: Occasional use   Objective:   BP 128/82 (BP Location: Right Arm, Patient Position: Sitting, Cuff Size: Normal)   Pulse 70   Temp 98.3 F (36.8 C)   Resp 16   Ht 6\' 1"  (1.854 m)   Wt 230 lb (104.3 kg)   SpO2 99%   BMI 30.34 kg/m  Vitals:   10/24/17 1101  BP: 128/82  Pulse: 70  Resp: 16  Temp: 98.3 F (36.8 C)  SpO2: 99%  Weight: 230 lb (104.3 kg)  Height: 6\' 1"  (1.854 m)     Physical Exam  General appearance: alert, well developed, well nourished, cooperative and in no distress Head: Normocephalic, without obvious abnormality,  atraumatic Respiratory: Respirations even and unlabored, normal respiratory rate Rectal: small non-bleeding hemorrhoid that does not appear irritated. Stool is soft, brown and hemoccult negative.      Assessment & Plan:     1. Blood in stool May be due to hemorrhoid, but he has had some other changes in stool the last few months.  - Ambulatory referral to Gastroenterology  2. Change in bowel habit  - Ambulatory referral to Gastroenterology       Lelon Huh, MD  [facility]

## 2017-11-03 DIAGNOSIS — Z0000 Encounter for general adult medical examination without abnormal findings: Secondary | ICD-10-CM | POA: Diagnosis not present

## 2017-11-03 DIAGNOSIS — E785 Hyperlipidemia, unspecified: Secondary | ICD-10-CM | POA: Diagnosis not present

## 2017-11-03 DIAGNOSIS — E039 Hypothyroidism, unspecified: Secondary | ICD-10-CM | POA: Diagnosis not present

## 2017-11-20 ENCOUNTER — Ambulatory Visit (INDEPENDENT_AMBULATORY_CARE_PROVIDER_SITE_OTHER): Payer: BLUE CROSS/BLUE SHIELD | Admitting: Gastroenterology

## 2017-11-20 ENCOUNTER — Encounter: Payer: Self-pay | Admitting: Gastroenterology

## 2017-11-20 ENCOUNTER — Other Ambulatory Visit: Payer: Self-pay

## 2017-11-20 VITALS — BP 130/83 | HR 69 | Ht 73.0 in | Wt 223.8 lb

## 2017-11-20 DIAGNOSIS — K625 Hemorrhage of anus and rectum: Secondary | ICD-10-CM | POA: Diagnosis not present

## 2017-11-20 NOTE — Progress Notes (Signed)
Matthew Bellows MD, MRCP(U.K) [address]  [place], [place] [address]  Main: [phone]  Fax: [phone]   Gastroenterology Consultation  Referring Provider:     Birdie Sons, MD Primary Care Physician:  Matthew Common, PA Primary Gastroenterologist:  Dr. Jonathon York  Reason for Consultation:     Rectal bleeding         HPI:   Matthew York is a 37 y.o. y/o male referred for consultation & management  by Dr. Natale York, Matthew Muff, PA.    He has been referred for blood in the stool.  Rectal bleeding :  Onset and where was blood seen  :He says that about a month back , had noticed some blood in his stool , bright red in color, mostly in the water, some on the toilet paper, lasted a few days and tapered off. Had no further for a few days and then one morning he says that he had a bowel movement - was very hard and then noticed he was having difficulty passing stool, pushed hard , had stinging pain and noticed some blood, "decent amount " went on for a few days and stopped. Last 2 weeks no blood.  Frequency of bowel movements :avergae 1 per day  Consistency : "normal", sometimes hard  Change in shape of stool:over the last few months - noticed flattening of stool at times  Pain associated with bowel movements:no  Blood thinner usage:no  NSAID's: no  Prior colonoscopy :no  Family history of colon cancer or polyps:unsure about his dad  Weight loss:no       Past Medical History:  Diagnosis Date  . Elevated blood pressure     Past Surgical History:  Procedure Laterality Date  . VASECTOMY  2014    Prior to Admission medications   Medication Sig Start Date End Date Taking? Authorizing Provider  metoprolol succinate (TOPROL-XL) 50 MG 24 hr tablet TAKE 1 TABLET (50 MG TOTAL) BY MOUTH DAILY. 06/10/16   Chrismon, Matthew Muff, PA    Family History  Problem Relation Age of Onset  . Hypertension Father      Social History   Tobacco Use  . Smoking status:  Never Smoker  . Smokeless tobacco: Never Used  Substance Use Topics  . Alcohol use: Yes    Comment: Occasional use  . Drug use: No    Allergies as of 11/20/2017  . (No Known Allergies)    Review of Systems:    All systems reviewed and negative except where noted in HPI.   Physical Exam:  There were no vitals taken for this visit. No LMP for male patient. Psych:  Alert and cooperative. Normal mood and affect. General:   Alert,  Well-developed, well-nourished, pleasant and cooperative in NAD Head:  Normocephalic and atraumatic. Eyes:  Sclera clear, no icterus.   Conjunctiva pink. Ears:  Normal auditory acuity. Nose:  No deformity, discharge, or lesions. Mouth:  No deformity or lesions,oropharynx pink & moist. Neck:  Supple; no masses or thyromegaly. Lungs:  Respirations even and unlabored.  Clear throughout to auscultation.   No wheezes, crackles, or rhonchi. No acute distress. Heart:  Regular rate and rhythm; no murmurs, clicks, rubs, or gallops. Abdomen:  Normal bowel sounds.  No bruits.  Soft, non-tender and non-distended without masses, hepatosplenomegaly or hernias noted.  No guarding or rebound tenderness.    Neurologic:  Alert and oriented x3;  grossly normal neurologically. Skin:  Intact without significant lesions or rashes.  No jaundice. Lymph Nodes:  No significant cervical adenopathy. Psych:  Alert and cooperative. Normal mood and affect.  Imaging Studies: No results found.  Assessment and Plan:   Barnard Sharps is a 37 y.o. y/o male has been referred for rectal bleeding.Likely hemorroidal secondary to constipation.   Plan 1. Diagnostic colonoscopy. 2.  Check CBC. 3. High fiber diet   I have discussed alternative options, risks & benefits,  which include, but are not limited to, bleeding, infection, perforation,respiratory complication & drug reaction.  The patient agrees with this plan & written consent will be obtained.    Follow up PRN  Dr Matthew Bellows  MD,MRCP(U.K)

## 2017-11-20 NOTE — Patient Instructions (Signed)
High-Fiber Diet  Fiber, also called dietary fiber, is a type of carbohydrate found in fruits, vegetables, whole grains, and beans. A high-fiber diet can have many health benefits. Your health care provider may recommend a high-fiber diet to help:  · Prevent constipation. Fiber can make your bowel movements more regular.  · Lower your cholesterol.  · Relieve hemorrhoids, uncomplicated diverticulosis, or irritable bowel syndrome.  · Prevent overeating as part of a weight-loss plan.  · Prevent heart disease, type 2 diabetes, and certain cancers.    What is my plan?  The recommended daily intake of fiber includes:  · 38 grams for men under age 50.  · 30 grams for men over age 50.  · 25 grams for women under age 50.  · 21 grams for women over age 50.    You can get the recommended daily intake of dietary fiber by eating a variety of fruits, vegetables, grains, and beans. Your health care provider may also recommend a fiber supplement if it is not possible to get enough fiber through your diet.  What do I need to know about a high-fiber diet?  · Fiber supplements have not been widely studied for their effectiveness, so it is better to get fiber through food sources.  · Always check the fiber content on the nutrition facts label of any prepackaged food. Look for foods that contain at least 5 grams of fiber per serving.  · Ask your dietitian if you have questions about specific foods that are related to your condition, especially if those foods are not listed in the following section.  · Increase your daily fiber consumption gradually. Increasing your intake of dietary fiber too quickly may cause bloating, cramping, or gas.  · Drink plenty of water. Water helps you to digest fiber.  What foods can I eat?  Grains  Whole-grain breads. Multigrain cereal. Oats and oatmeal. Brown rice. Barley. Bulgur wheat. Millet. Bran muffins. Popcorn. Rye wafer crackers.  Vegetables   Sweet potatoes. Spinach. Kale. Artichokes. Cabbage. Broccoli. Green peas. Carrots. Squash.  Fruits  Berries. Pears. Apples. Oranges. Avocados. Prunes and raisins. Dried figs.  Meats and Other Protein Sources  Navy, kidney, pinto, and soy beans. Split peas. Lentils. Nuts and seeds.  Dairy  Fiber-fortified yogurt.  Beverages  Fiber-fortified soy milk. Fiber-fortified orange juice.  Other  Fiber bars.  The items listed above may not be a complete list of recommended foods or beverages. Contact your dietitian for more options.  What foods are not recommended?  Grains  White bread. Pasta made with refined flour. White rice.  Vegetables  Fried potatoes. Canned vegetables. Well-cooked vegetables.  Fruits  Fruit juice. Cooked, strained fruit.  Meats and Other Protein Sources  Fatty cuts of meat. Fried poultry or fried fish.  Dairy  Milk. Yogurt. Cream cheese. Sour cream.  Beverages  Soft drinks.  Other  Cakes and pastries. Butter and oils.  The items listed above may not be a complete list of foods and beverages to avoid. Contact your dietitian for more information.  What are some tips for including high-fiber foods in my diet?  · Eat a wide variety of high-fiber foods.  · Make sure that half of all grains consumed each day are whole grains.  · Replace breads and cereals made from refined flour or white flour with whole-grain breads and cereals.  · Replace white rice with brown rice, bulgur wheat, or millet.  · Start the day with a breakfast that is high in fiber,   such as a cereal that contains at least 5 grams of fiber per serving.  · Use beans in place of meat in soups, salads, or pasta.  · Eat high-fiber snacks, such as berries, raw vegetables, nuts, or popcorn.  This information is not intended to replace advice given to you by your health care provider. Make sure you discuss any questions you have with your health care provider.  Document Released: 02/04/2005 Document Revised: 07/13/2015 Document Reviewed: 07/20/2013   Elsevier Interactive Patient Education © 2018 [employer]

## 2017-11-21 DIAGNOSIS — E039 Hypothyroidism, unspecified: Secondary | ICD-10-CM | POA: Diagnosis not present

## 2017-11-21 DIAGNOSIS — E781 Pure hyperglyceridemia: Secondary | ICD-10-CM | POA: Diagnosis not present

## 2017-11-21 DIAGNOSIS — I10 Essential (primary) hypertension: Secondary | ICD-10-CM | POA: Diagnosis not present

## 2017-11-21 LAB — CBC WITH DIFFERENTIAL/PLATELET
BASOS ABS: 0 10*3/uL (ref 0.0–0.2)
Basos: 1 %
EOS (ABSOLUTE): 0.1 10*3/uL (ref 0.0–0.4)
Eos: 2 %
Hematocrit: 47.8 % (ref 37.5–51.0)
Hemoglobin: 16.5 g/dL (ref 13.0–17.7)
Immature Grans (Abs): 0 10*3/uL (ref 0.0–0.1)
Immature Granulocytes: 0 %
LYMPHS ABS: 2.4 10*3/uL (ref 0.7–3.1)
Lymphs: 41 %
MCH: 28.5 pg (ref 26.6–33.0)
MCHC: 34.5 g/dL (ref 31.5–35.7)
MCV: 83 fL (ref 79–97)
Monocytes Absolute: 0.4 10*3/uL (ref 0.1–0.9)
Monocytes: 7 %
NEUTROS ABS: 2.9 10*3/uL (ref 1.4–7.0)
Neutrophils: 49 %
PLATELETS: 279 10*3/uL (ref 150–450)
RBC: 5.79 x10E6/uL (ref 4.14–5.80)
RDW: 13.1 % (ref 12.3–15.4)
WBC: 5.8 10*3/uL (ref 3.4–10.8)

## 2017-11-23 ENCOUNTER — Encounter: Payer: Self-pay | Admitting: Gastroenterology

## 2017-11-24 ENCOUNTER — Telehealth: Payer: Self-pay | Admitting: Gastroenterology

## 2017-11-24 NOTE — Telephone Encounter (Signed)
Patient called to cancel his colonocsopy scheduled for 12-01-17 with Dr Vicente Males

## 2017-12-01 ENCOUNTER — Ambulatory Visit: Admit: 2017-12-01 | Payer: BLUE CROSS/BLUE SHIELD | Admitting: Gastroenterology

## 2017-12-01 SURGERY — COLONOSCOPY WITH PROPOFOL
Anesthesia: General

## 2018-02-04 DIAGNOSIS — E039 Hypothyroidism, unspecified: Secondary | ICD-10-CM | POA: Diagnosis not present

## 2018-02-04 DIAGNOSIS — Z0000 Encounter for general adult medical examination without abnormal findings: Secondary | ICD-10-CM | POA: Diagnosis not present

## 2018-02-16 DIAGNOSIS — E039 Hypothyroidism, unspecified: Secondary | ICD-10-CM | POA: Diagnosis not present

## 2018-02-16 DIAGNOSIS — E785 Hyperlipidemia, unspecified: Secondary | ICD-10-CM | POA: Diagnosis not present

## 2018-02-16 DIAGNOSIS — I10 Essential (primary) hypertension: Secondary | ICD-10-CM | POA: Diagnosis not present

## 2018-05-04 DIAGNOSIS — E039 Hypothyroidism, unspecified: Secondary | ICD-10-CM | POA: Diagnosis not present

## 2018-05-04 DIAGNOSIS — Z0000 Encounter for general adult medical examination without abnormal findings: Secondary | ICD-10-CM | POA: Diagnosis not present

## 2018-05-26 DIAGNOSIS — I10 Essential (primary) hypertension: Secondary | ICD-10-CM | POA: Diagnosis not present

## 2018-05-26 DIAGNOSIS — E039 Hypothyroidism, unspecified: Secondary | ICD-10-CM | POA: Diagnosis not present

## 2018-05-26 DIAGNOSIS — Z760 Encounter for issue of repeat prescription: Secondary | ICD-10-CM | POA: Diagnosis not present

## 2018-06-18 ENCOUNTER — Encounter: Payer: Self-pay | Admitting: Family Medicine

## 2018-06-18 ENCOUNTER — Other Ambulatory Visit: Payer: Self-pay

## 2018-06-18 ENCOUNTER — Ambulatory Visit: Payer: BLUE CROSS/BLUE SHIELD | Admitting: Family Medicine

## 2018-06-18 VITALS — BP 130/92 | HR 82 | Temp 98.5°F | Wt 228.0 lb

## 2018-06-18 DIAGNOSIS — M7918 Myalgia, other site: Secondary | ICD-10-CM

## 2018-06-18 MED ORDER — METHOCARBAMOL 500 MG PO TABS: 500.0000 mg | ORAL_TABLET | Freq: Four times a day (QID) | ORAL | 0 refills | Status: DC

## 2018-06-18 NOTE — Progress Notes (Signed)
Matthew York  MRN: [id number] DOB: 28-Aug-1980  Subjective:  HPI   The patient is a 38 year old male who presents for evaluation of upper back pain.  He states he has had this issue for about 10 years on and off.  The current episode has been bothering him for about two weeks.   He describes this as a constant ache around the left of center area near his shoulder blades.  It is made worse with certain movement of the left arm.  In particular when reaching back to put something in the back pocket he feels sharp pain.  He reports it at times radiates into the left side of his neck.  He further states that movement of his head back and to the left makes it worse.  He said it does feel better when he is laying in bed or when he raises his arms.  It is of note that the patient is on medicine for his thyroid.  He thinks it might be Levothyroxine that the nurse practitioner from work is following him for.  He is to get the name and strength and report it back to [place].  Patient Active Problem List   Diagnosis Date Noted  . Hypertension 10/02/2015  . Allergic rhinitis, seasonal 12/23/2014  . Headache disorder 12/23/2014  . Sprain and strain of shoulder and upper arm 01/18/2008  . 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 Elevated blood pressure    Past Surgical History:  Procedure Laterality Date  . VASECTOMY  2014   Family History  Problem Relation Age of Onset  . Hypertension Father    Social History   Socioeconomic History  . Marital status: Married    Spouse name: Not on file  . Number of children: Not on file  . Years of education: Not on file  . Highest education level: Not on file  Occupational History  . Not on file  Social Needs  . Financial resource strain: Not on file  . Food insecurity:    Worry: Not on file    Inability: Not on file  . Transportation needs:    Medical: Not on file    Non-medical: Not on file   Tobacco Use  . Smoking status: Never Smoker  . Smokeless tobacco: Never Used  Substance and Sexual Activity  . Alcohol use: Yes    Comment: Occasional use  . Drug use: No  . Sexual activity: Not on file  Lifestyle  . Physical activity:    Days per week: Not on file    Minutes per session: Not on file  . Stress: Not on file  Relationships  . Social connections:    Talks on phone: Not on file    Gets together: Not on file    Attends religious service: Not on file    Active member of club or organization: Not on file    Attends meetings of clubs or organizations: Not on file    Relationship status: Not on file  . Intimate partner violence:    Fear of current or ex partner: Not on file    Emotionally abused: Not on file    Physically abused: Not on file    Forced sexual activity: Not on file  Other Topics Concern  . Not on file  Social History Narrative  . Not on file   Outpatient Encounter Medications as of 06/18/2018  Medication Sig  . metoprolol  succinate (TOPROL-XL) 50 MG 24 hr tablet TAKE 1 TABLET (50 MG TOTAL) BY MOUTH DAILY.   No facility-administered encounter medications on file as of 06/18/2018.    No Known Allergies  Review of Systems  Musculoskeletal: Positive for back pain and myalgias. Negative for neck pain.    Objective:  BP (!) 130/92 (BP Location: Right Arm, Patient Position: Sitting, Cuff Size: Normal)   Pulse 82   Temp 98.5 F (36.9 C) (Oral)   Wt 228 lb (103.4 kg)   SpO2 96%   BMI 30.08 kg/m   Physical Exam  Constitutional: He is oriented to person, place, and time and well-developed, well-nourished, and in no distress.  HENT:  Head: Normocephalic.  Eyes: Conjunctivae are normal.  Neck: Neck supple.  Cardiovascular: Normal rate and regular rhythm.  Pulmonary/Chest: Effort normal and breath sounds normal.  Abdominal: Soft. Bowel sounds are normal.  Musculoskeletal: Normal range of motion.     Comments: Minimal tenderness along the left  medial scapular border musculature. Good strength in upper extremities.   Neurological: He is alert and oriented to person, place, and time.  Skin: No rash noted.  Psychiatric: Mood, affect and judgment normal.    Assessment and Plan :  1. Rhomboid muscle pain Has intermittent sharp stabbing pains over the past "10 years" in the left rhomboid at the medial scapular border to reach behind his back. Stretching helps to relief spasm. No specific injury known at the onset. Trigger this time was straining to reach billfold in his left hip pocket. Apply moist heat or Aspercreme with Lidocaine. May use massage and stretching exercises (given handout). May need to consider PT or chiropractic treatment if no better in a week. - methocarbamol (ROBAXIN) 500 MG tablet; Take 1 tablet (500 mg total) by mouth 4 (four) times daily.  Dispense: 28 tablet; Refill: 0

## 2018-06-18 NOTE — Patient Instructions (Signed)
Shoulder Range of Motion Exercises  Shoulder range of motion (ROM) exercises are done to keep the shoulder moving freely or to increase movement. They are often recommended for people who have shoulder pain or stiffness or who are recovering from a shoulder surgery.  Phase 1 exercises  When you are able, do this exercise 1-2 times per day for 30-60 seconds in each direction, or as directed by your health care provider.  Pendulum exercise  To do this exercise while sitting:  1. Sit in a chair or at the edge of your bed with your feet flat on the floor.  2. Let your affected arm hang down in front of you over the edge of the bed or chair.  3. Relax your shoulder, arm, and hand.  4. Rock your body so your arm gently swings in small circles. You can also use your unaffected arm to start the motion.  5. Repeat changing the direction of the circles, swinging your arm left and right, and swinging your arm forward and back.  To do this exercise while standing:  1. Stand next to a sturdy chair or table, and hold on to it with your hand on your unaffected side.  2. Bend forward at the waist.  3. Bend your knees slightly.  4. Relax your shoulder, arm, and hand.  5. While keeping your shoulder relaxed, use body motion to swing your arm in small circles.  6. Repeat changing the direction of the circles, swinging your arm left and right, and swinging your arm forward and back.  7. Between exercises, stand up tall and take a short break to relax your lower back.    Phase 2 exercises  Do these exercises 1-2 times per day or as told by your health care provider. Hold each stretch for 30 seconds, and repeat 3 times. Do the exercises with one or both arms as instructed by your health care provider.  For these exercises, sit at a table with your hand and arm supported by the table. A chair that slides easily or has wheels can be helpful.  External rotation  1. Turn your chair so that your affected side is nearest to the  table.  2. Place your forearm on the table to your side. Bend your elbow about 90 at the elbow (right angle) and place your hand palm facing down on the table. Your elbow should be about 6 inches away from your side.  3. Keeping your arm on the table, lean your body forward.  Abduction  1. Turn your chair so that your affected side is nearest to the table.  2. Place your forearm and hand on the table so that your thumb points toward the ceiling and your arm is straight out to your side.  3. Slide your hand out to the side and away from you, using your unaffected arm to do the work.  4. To increase the stretch, you can slide your chair away from the table.  Flexion: forward stretch  1. Sit facing the table. Place your hand and elbow on the table in front of you.  2. Slide your hand forward and away from you, using your unaffected arm to do the work.  3. To increase the stretch, you can slide your chair backward.  Phase 3 exercises  Do these exercises 1-2 times per day or as told by your health care provider. Hold each stretch for 30 seconds, and repeat 3 times. Do the exercises with one or   both arms as instructed by your health care provider.  Cross-body stretch: posterior capsule stretch  1. Lift your arm straight out in front of you.  2. Bend your arm 90 at the elbow (right angle) so your forearm moves across your body.  3. Use your other arm to gently pull the elbow across your body, toward your other shoulder.  Wall climbs  1. Stand with your affected arm extended out to the side with your hand resting on a door frame.  2. Slide your hand slowly up the door frame.  3. To increase the stretch, step through the door frame. Keep your body upright and do not lean.  Wand exercises  You will need a cane, a piece of PVC pipe, or a sturdy wooden dowel for wand exercises.  Flexion  To do this exercise while standing:  1. Hold the wand with both of your hands, palms down.  2. Using the other arm to help, lift your arms  up and over your head, if able.  3. Push upward with your other arm to gently increase the stretch.  To do this exercise while lying down:  1. Lie on your back with your elbows resting on the floor and the wand in both your hands. Your hands will be palm down, or pointing toward your feet.  2. Lift your hands toward the ceiling, using your unaffected arm to help if needed.  3. Bring your arms overhead as able, using your unaffected arm to help if needed.  Internal rotation  1. Stand while holding the wand behind you with both hands. Your unaffected arm should be extended above your head with the arm of the affected side extended behind you at the level of your waist. The wand should be pointing straight up and down as you hold it.  2. Slowly pull the wand up behind your back by straightening the elbow of your unaffected arm and bending the elbow of your affected arm.  External rotation  1. Lie on your back with your affected upper arm supported on a small pillow or rolled towel. When you first do this exercise, keep your upper arm close to your body. Over time, bring your arm up to a 90 angle out to the side.  2. Hold the wand across your stomach and with both hands palm up. Your elbow on your affected side should be bent at a 90 angle.  3. Use your unaffected side to help push your forearm away from you and toward the floor. Keep your elbow on your affected side bent at a 90 angle.  Contact a health care provider if you have:   New or increasing pain.   New numbness, tingling, weakness, or discoloration in your arm or hand.  This information is not intended to replace advice given to you by your health care provider. Make sure you discuss any questions you have with your health care provider.  Document Released: 11/03/2002 Document Revised: 03/19/2017 Document Reviewed: 03/19/2017  Elsevier Interactive Patient Education  2019 [employer]

## 2018-10-27 DIAGNOSIS — Z760 Encounter for issue of repeat prescription: Secondary | ICD-10-CM | POA: Diagnosis not present

## 2018-10-27 DIAGNOSIS — E039 Hypothyroidism, unspecified: Secondary | ICD-10-CM | POA: Diagnosis not present

## 2018-12-15 ENCOUNTER — Ambulatory Visit (INDEPENDENT_AMBULATORY_CARE_PROVIDER_SITE_OTHER): Payer: BC Managed Care – PPO | Admitting: Family Medicine

## 2018-12-15 ENCOUNTER — Other Ambulatory Visit: Payer: Self-pay

## 2018-12-15 ENCOUNTER — Encounter: Payer: Self-pay | Admitting: Family Medicine

## 2018-12-15 VITALS — BP 140/78 | HR 78 | Temp 97.3°F | Ht 73.0 in | Wt 232.0 lb

## 2018-12-15 DIAGNOSIS — E039 Hypothyroidism, unspecified: Secondary | ICD-10-CM

## 2018-12-15 DIAGNOSIS — N529 Male erectile dysfunction, unspecified: Secondary | ICD-10-CM | POA: Diagnosis not present

## 2018-12-15 DIAGNOSIS — Z23 Encounter for immunization: Secondary | ICD-10-CM | POA: Diagnosis not present

## 2018-12-15 DIAGNOSIS — Z0000 Encounter for general adult medical examination without abnormal findings: Secondary | ICD-10-CM | POA: Diagnosis not present

## 2018-12-15 DIAGNOSIS — I10 Essential (primary) hypertension: Secondary | ICD-10-CM

## 2018-12-15 NOTE — Progress Notes (Signed)
Patient: Matthew York, Male    DOB: [birth year], 38 y.o.   MRN: [id number] Visit Date: 12/15/2018  Today's Provider: Vernie Murders, PA   Chief Complaint  Patient presents with  . Annual Exam   Subjective:  Matthew York is a 38 y.o. male who presents today for health maintenance and complete physical. He feels well. He reports exercising daily. He reports he is sleeping well.  Review of Systems  Constitutional: Negative.   HENT: Negative.   Eyes: Positive for photophobia.  Respiratory: Negative.   Cardiovascular: Negative.   Gastrointestinal: Negative.   Endocrine: Negative.   Genitourinary: Negative.   Musculoskeletal: Negative.   Skin: Negative.   Allergic/Immunologic: Negative.   Neurological: Negative.   Hematological: Negative.   Psychiatric/Behavioral: Negative.    Social History   Socioeconomic History  . Marital status: Married    Spouse name: Not on file  . Number of children: Not on file  . Years of education: Not on file  . Highest education level: Not on file  Occupational History  . Not on file  Social Needs  . Financial resource strain: Not on file  . Food insecurity    Worry: Not on file    Inability: Not on file  . Transportation needs    Medical: Not on file    Non-medical: Not on file  Tobacco Use  . Smoking status: Never Smoker  . Smokeless tobacco: Never Used  Substance and Sexual Activity  . Alcohol use: Yes    Comment: Occasional use  . Drug use: No  . Sexual activity: Not on file  Lifestyle  . Physical activity    Days per week: Not on file    Minutes per session: Not on file  . Stress: Not on file  Relationships  . Social Herbalist on phone: Not on file    Gets together: Not on file    Attends religious service: Not on file    Active member of club or organization: Not on file    Attends meetings of clubs or organizations: Not on file    Relationship status: Not on file  . Intimate partner violence    Fear of current or  ex partner: Not on file    Emotionally abused: Not on file    Physically abused: Not on file    Forced sexual activity: Not on file  Other Topics Concern  . Not on file  Social History Narrative  . Not on file   Patient Active Problem List   Diagnosis Date Noted  . Hypertension 10/02/2015  . Allergic rhinitis, seasonal 12/23/2014  . Headache disorder 12/23/2014  . Sprain and strain of shoulder and upper arm 01/18/2008  . 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 VASECTOMY  2014   His family history includes Hypertension in his father.     Outpatient Encounter Medications as of 12/15/2018  Medication Sig  . levothyroxine (SYNTHROID) 88 MCG tablet Take 88 mcg by mouth daily before breakfast.  . metoprolol succinate (TOPROL-XL) 50 MG 24 hr tablet TAKE 1 TABLET (50 MG TOTAL) BY MOUTH DAILY.  . [DISCONTINUED] methocarbamol (ROBAXIN) 500 MG tablet Take 1 tablet (500 mg total) by mouth 4 (four) times daily.   No facility-administered encounter medications on file as of 12/15/2018.    No Known Allergies   Patient Care Team: Tenea Sens, Vickki Muff, PA as PCP - General (Family Medicine)  Objective:   Vitals:  Vitals:   12/15/18 1334  BP: 140/78  Pulse: 78  Temp: (!) 97.3 F (36.3 C)  TempSrc: Skin  SpO2: 96%  Weight: 232 lb (105.2 kg)  Height: 6\' 1"  (1.854 m)   Wt Readings from Last 3 Encounters:  12/15/18 232 lb (105.2 kg)  06/18/18 228 lb (103.4 kg)  11/20/17 223 lb 12.8 oz (101.5 kg)   Physical Exam Constitutional:      Appearance: He is well-developed.  HENT:     Head: Normocephalic and atraumatic.     Right Ear: External ear normal.     Left Ear: External ear normal.     Nose: Nose normal.  Eyes:     General:        Right eye: No discharge.     Conjunctiva/sclera: Conjunctivae normal.     Pupils: Pupils are equal, round, and reactive to light.  Neck:     Musculoskeletal: Normal  range of motion and neck supple.     Thyroid: No thyromegaly.     Trachea: No tracheal deviation.  Cardiovascular:     Rate and Rhythm: Normal rate and regular rhythm.     Pulses: Normal pulses.     Heart sounds: Normal heart sounds. No murmur.  Pulmonary:     Effort: Pulmonary effort is normal. No respiratory distress.     Breath sounds: Normal breath sounds. No wheezing or rales.  Chest:     Chest wall: No tenderness.  Abdominal:     General: Bowel sounds are normal. There is no distension.     Palpations: Abdomen is soft. There is no mass.     Tenderness: There is no abdominal tenderness. There is no guarding or rebound.  Genitourinary:    Penis: Normal.      Scrotum/Testes: Normal.     Prostate: Normal.     Rectum: Normal. Guaiac result negative.  Musculoskeletal: Normal range of motion.        General: No tenderness.  Lymphadenopathy:     Cervical: No cervical adenopathy.  Skin:    General: Skin is warm and dry.     Findings: No erythema or rash.  Neurological:     Mental Status: He is alert and oriented to person, place, and time.     Cranial Nerves: No cranial nerve deficit.     Motor: No abnormal muscle tone.     Coordination: Coordination normal.     Deep Tendon Reflexes: Reflexes are normal and symmetric. Reflexes normal.  Psychiatric:        Behavior: Behavior normal.        Thought Content: Thought content normal.        Judgment: Judgment normal.    Depression Screen PHQ 2/9 Scores 12/15/2018 07/15/2018  PHQ - 2 Score 0 0   Assessment & Plan:     Routine Health Maintenance and Physical Exam  Exercise Activities and Dietary recommendations Goals   Continue walking 3 miles a day or 30-40 minute workout 4 days a week. Reduce caloric intake to lose some weight.    Immunization History  Administered Date(s) Administered  . Influenza,inj,Quad PF,6+ Mos 12/23/2014   Health Maintenance  Topic Date Due  . HIV Screening  09/02/1993  . TETANUS/TDAP   09/02/1997  . INFLUENZA VACCINE  09/19/2018   Discussed health benefits of physical activity, and encouraged him to engage in regular exercise appropriate for his age and condition.   1. Annual physical exam General health stable. Has  gained 4 lbs since April 2020. Will give flu shot today and advised he needs Tdap (wants to postpone for a month). Given anticipatory counseling and will check CBC, CMP, Lipid Panel with TSH. - CBC with Differential/Platelet - Comprehensive metabolic panel - Lipid panel - TSH  2. Essential hypertension Fair control of BP. Has not been taking the Toprol-XL 50 mg qd the past few months. Recheck routine labs. Denies palpitations, chest pains or dyspnea. - CBC with Differential/Platelet - Comprehensive metabolic panel - Lipid panel - TSH  3. Erectile dysfunction, unspecified erectile dysfunction type Difficulty maintaining erections during intercourse. Will check testosterone levels and general chemistry tests with thyroid function screening. - Comprehensive metabolic panel - TSH - Testosterone,Free and Total  4. Hypothyroidism, unspecified type [facility]. No lab reports available. No thyroid goiter or nodules palpable. Will recheck CBC and TSH. Continue present medications. - levothyroxine (SYNTHROID) 88 MCG tablet; Take 88 mcg by mouth daily before breakfast. - CBC with Differential/Platelet - TSH  5. Need for influenza vaccination - Flu Vaccine QUAD 6+ mos PF IM (Fluarix Quad PF)

## 2018-12-18 DIAGNOSIS — N529 Male erectile dysfunction, unspecified: Secondary | ICD-10-CM | POA: Diagnosis not present

## 2018-12-18 DIAGNOSIS — Z0000 Encounter for general adult medical examination without abnormal findings: Secondary | ICD-10-CM | POA: Diagnosis not present

## 2018-12-18 DIAGNOSIS — I10 Essential (primary) hypertension: Secondary | ICD-10-CM | POA: Diagnosis not present

## 2018-12-19 LAB — CBC WITH DIFFERENTIAL/PLATELET
Basophils Absolute: 0.1 10*3/uL (ref 0.0–0.2)
Basos: 1 %
EOS (ABSOLUTE): 0.1 10*3/uL (ref 0.0–0.4)
Eos: 2 %
Hematocrit: 47.5 % (ref 37.5–51.0)
Hemoglobin: 16.4 g/dL (ref 13.0–17.7)
Immature Grans (Abs): 0 10*3/uL (ref 0.0–0.1)
Immature Granulocytes: 0 %
Lymphocytes Absolute: 2.1 10*3/uL (ref 0.7–3.1)
Lymphs: 41 %
MCH: 28.4 pg (ref 26.6–33.0)
MCHC: 34.5 g/dL (ref 31.5–35.7)
MCV: 82 fL (ref 79–97)
Monocytes Absolute: 0.4 10*3/uL (ref 0.1–0.9)
Monocytes: 9 %
Neutrophils Absolute: 2.4 10*3/uL (ref 1.4–7.0)
Neutrophils: 47 %
Platelets: 249 10*3/uL (ref 150–450)
RBC: 5.77 x10E6/uL (ref 4.14–5.80)
RDW: 12.9 % (ref 11.6–15.4)
WBC: 5.1 10*3/uL (ref 3.4–10.8)

## 2018-12-19 LAB — COMPREHENSIVE METABOLIC PANEL
ALT: 24 IU/L (ref 0–44)
AST: 19 IU/L (ref 0–40)
Albumin/Globulin Ratio: 1.8 (ref 1.2–2.2)
Albumin: 4.4 g/dL (ref 4.0–5.0)
Alkaline Phosphatase: 62 IU/L (ref 39–117)
BUN/Creatinine Ratio: 15 (ref 9–20)
BUN: 17 mg/dL (ref 6–24)
Bilirubin Total: 0.9 mg/dL (ref 0.0–1.2)
CO2: 20 mmol/L (ref 20–29)
Calcium: 9.3 mg/dL (ref 8.7–10.2)
Chloride: 102 mmol/L (ref 96–106)
Creatinine, Ser: 1.17 mg/dL (ref 0.76–1.27)
GFR calc Af Amer: 90 mL/min/{1.73_m2} (ref >59)
GFR calc non Af Amer: 78 mL/min/{1.73_m2} (ref >59)
Globulin, Total: 2.4 g/dL (ref 1.5–4.5)
Glucose: 83 mg/dL (ref 65–99)
Potassium: 4.3 mmol/L (ref 3.5–5.2)
Sodium: 138 mmol/L (ref 134–144)
Total Protein: 6.8 g/dL (ref 6.0–8.5)

## 2018-12-19 LAB — LIPID PANEL
Chol/HDL Ratio: 5.6 ratio — ABNORMAL HIGH (ref 0.0–5.0)
Cholesterol, Total: 168 mg/dL (ref 100–199)
HDL: 30 mg/dL — ABNORMAL LOW (ref >39)
LDL Chol Calc (NIH): 86 mg/dL (ref 0–99)
Triglycerides: 318 mg/dL — ABNORMAL HIGH (ref 0–149)
VLDL Cholesterol Cal: 52 mg/dL — ABNORMAL HIGH (ref 5–40)

## 2018-12-19 LAB — TESTOSTERONE,FREE AND TOTAL
Testosterone, Free: 14.1 pg/mL (ref 6.8–21.5)
Testosterone: 344 ng/dL (ref 264–916)

## 2018-12-19 LAB — TSH: TSH: 3.91 u[IU]/mL (ref 0.450–4.500)

## 2018-12-22 ENCOUNTER — Telehealth: Payer: Self-pay

## 2018-12-22 NOTE — Telephone Encounter (Signed)
Advised 

## 2018-12-22 NOTE — Telephone Encounter (Signed)
-----   Message from Margo Common, [place] sent at 12/21/2018  8:07 AM EST ----- All blood tests normal except low LDL and high triglycerides. Recommend Flush Free Niacin 500 mg qd and low fat diet with 30-40 minutes of exercise 3-4 days a week. Testosterone and thyroid tests are normal. Continue present medications. Recheck appointment should be scheduled in 3 months.

## 2018-12-22 NOTE — Telephone Encounter (Signed)
LMTCB

## 2018-12-22 NOTE — Telephone Encounter (Signed)
Pt returned call. Thanks TNP °

## 2018-12-28 ENCOUNTER — Telehealth: Payer: Self-pay | Admitting: Family Medicine

## 2018-12-28 NOTE — Telephone Encounter (Signed)
Pt wanting to ask how soon he will see results on over the counter medication Simona Huh had advised him to use.  Please call pt back at [phone].  Thanks, [employer]

## 2018-12-29 ENCOUNTER — Other Ambulatory Visit: Payer: Self-pay

## 2018-12-29 DIAGNOSIS — N529 Male erectile dysfunction, unspecified: Secondary | ICD-10-CM

## 2018-12-29 MED ORDER — TADALAFIL 5 MG PO TABS: 5.0000 mg | ORAL_TABLET | Freq: Every day | ORAL | 3 refills | Status: DC | PRN

## 2018-12-29 NOTE — Telephone Encounter (Signed)
Patient was under the impression he was being treated for his ED.  Since that is not what the Niacin was for per Simona Huh we will try Cialis 5mg  and see if he gets results with it.

## 2018-12-29 NOTE — Telephone Encounter (Signed)
LMTCB

## 2018-12-29 NOTE — Telephone Encounter (Signed)
Patient was advised to see where he wanted the Rx sent and he will get back to me.

## 2019-04-21 NOTE — Progress Notes (Signed)
Patient: Carolyn Chuang Male    DOB: [birth year]   39 y.o.   MRN: [id number] Visit Date: 04/22/2019  Today's Provider: Vernie Murders, PA   Chief Complaint  Patient presents with  . Hyperlipidemia  . Hypertension  . Hypothyroidism   Subjective:     HPI  Lipid/Cholesterol, Follow-up:   Last seen for this 5 months ago.  Management changes since that visit include recommend starting Flush Free Niacin 500 mg qd and low fat diet with 30-40 minutes of exercise 3-4 days a week.. . Last Lipid Panel:    Component Value Date/Time   CHOL 168 12/18/2018 0904   TRIG 318 (H) 12/18/2018 0904   HDL 30 (L) 12/18/2018 0904   CHOLHDL 5.6 (H) 12/18/2018 0904   LDLCALC 86 12/18/2018 0904    Risk factors for vascular disease include hypercholesterolemia and hypertension  He reports good compliance with treatment. He is not having side effects.  Current symptoms include none and have been stable. Weight trend: stable Prior visit with dietician: no Current diet: in general, an "unhealthy" diet Current exercise: walking  Wt Readings from Last 3 Encounters:  04/22/19 233 lb (105.7 kg)  12/15/18 232 lb (105.2 kg)  06/18/18 228 lb (103.4 kg)    -------------------------------------------------------------------  Hypertension, follow-up:  BP Readings from Last 3 Encounters:  04/22/19 (!) 130/92  12/15/18 140/78  06/18/18 (!) 130/92    He was last seen for hypertension 5 months ago.  BP at that visit was 140/78. Management since that visit includes no changes. He reports good compliance with treatment. He is not having side effects.  He is exercising. He is adherent to low salt diet.   Outside blood pressures are not checked. He is experiencing none.  Patient denies chest pain, chest pressure/discomfort, claudication, dyspnea, exertional chest pressure/discomfort, fatigue, irregular heart beat, lower extremity edema, near-syncope, orthopnea, palpitations, paroxysmal nocturnal  dyspnea, syncope and tachypnea.   Cardiovascular risk factors include hypertension and male gender.  Use of agents associated with hypertension: thyroid hormones.     Weight trend: stable Wt Readings from Last 3 Encounters:  04/22/19 233 lb (105.7 kg)  12/15/18 232 lb (105.2 kg)  06/18/18 228 lb (103.4 kg)    Current diet: in general, an "unhealthy" diet  ------------------------------------------------------------------------   Follow up for Hypothyroid:  The patient was last seen for this 5 months ago. Changes made at last visit include none.  He reports good compliance with treatment. He feels that condition is Unchanged. He is not having side effects.   ------------------------------------------------------------------------------------  Follow up for Erectile Dysfunction:  The patient was last seen for this 5 months ago. Changes made at last visit include prescribing Cialis.  He reports good compliance with treatment. He feels that condition is Unchanged. He is not having side effects.   ------------------------------------------------------------------------------------ Past Medical History:  Diagnosis Date  . Elevated blood pressure    Past Surgical History:  Procedure Laterality Date  . VASECTOMY  2014   Family History  Problem Relation Age of Onset  . Hypertension Father    No Known Allergies  Current Outpatient Medications:  .  Inositol Niacinate (NIACIN FLUSH FREE) 500 MG CAPS, Take 1 capsule by mouth daily., Disp: , Rfl:  .  levothyroxine (SYNTHROID) 88 MCG tablet, Take 88 mcg by mouth daily before breakfast., Disp: , Rfl:  .  metoprolol succinate (TOPROL-XL) 50 MG 24 hr tablet, TAKE 1 TABLET (50 MG TOTAL) BY MOUTH DAILY., Disp: 90 tablet, Rfl: 1 .  tadalafil (CIALIS) 5 MG tablet, Take 1 tablet (5 mg total) by mouth daily as needed for erectile dysfunction., Disp: 30 tablet, Rfl: 3  Review of Systems  Constitutional: Negative for appetite change,  chills and fever.  Respiratory: Negative for chest tightness, shortness of breath and wheezing.   Cardiovascular: Negative for chest pain and palpitations.  Gastrointestinal: Negative for abdominal pain, nausea and vomiting.    Social History   Tobacco Use  . Smoking status: Never Smoker  . Smokeless tobacco: Never Used  Substance Use Topics  . Alcohol use: Yes    Comment: Occasional use      Objective:   BP (!) 130/92 (BP Location: Left Arm, Cuff Size: Large)   Pulse 75   Temp (!) 97.1 F (36.2 C) (Temporal)   Resp 16   Wt 233 lb (105.7 kg)   SpO2 98% Comment: room air  BMI 30.74 kg/m  Vitals:   04/22/19 1453 04/22/19 1459  BP: (!) 130/98 (!) 130/92  Pulse: 75   Resp: 16   Temp: (!) 97.1 F (36.2 C)   TempSrc: Temporal   SpO2: 98%   Weight: 233 lb (105.7 kg)   Body mass index is 30.74 kg/m.  Physical Exam Constitutional:      General: He is not in acute distress.    Appearance: He is well-developed.  HENT:     Head: Normocephalic and atraumatic.     Right Ear: Hearing and tympanic membrane normal.     Left Ear: Hearing and tympanic membrane normal.     Nose: Nose normal.  Eyes:     General: Lids are normal. No scleral icterus.       Right eye: No discharge.        Left eye: No discharge.     Conjunctiva/sclera: Conjunctivae normal.  Cardiovascular:     Rate and Rhythm: Normal rate and regular rhythm.     Heart sounds: Normal heart sounds.  Pulmonary:     Effort: Pulmonary effort is normal. No respiratory distress.  Musculoskeletal:        General: Normal range of motion.     Cervical back: Normal range of motion and neck supple.  Skin:    Findings: No lesion or rash.  Neurological:     Mental Status: He is alert and oriented to person, place, and time.  Psychiatric:        Speech: Speech normal.        Behavior: Behavior normal.        Thought Content: Thought content normal.       Assessment & Plan    1. Mixed hyperlipidemia Tolerating the  Flush-Free Niacin and will recheck labs. Continue low fat diet and regular exercise. Follow up pending lab reports.  - Comprehensive metabolic panel - Lipid panel - TSH  2. Essential hypertension Good systolic control with elevated diastolic pressure today, initially (was 98 then down to 92). Continue Metoprolol Succinate 50 mg qd, restrict salt and caffeine intake and work on some weight loss. Recheck CBC, CMP and thyroid function. - CBC with Differential/Platelet - Comprehensive metabolic panel - TSH  3. Hypothyroidism, unspecified type No palpitations, exophthalmos, tremor, constipation, hair loss or myxedema. Still taking Levothyroxine 88 mcg qd. Recheck labs.  - CBC with Differential/Platelet - Comprehensive metabolic panel - TSH - T4 - levothyroxine (SYNTHROID) 88 MCG tablet; Take 1 tablet (88 mcg total) by mouth daily before breakfast.  Dispense: 90 tablet; Refill: 3  4. Erectile dysfunction, unspecified erectile dysfunction  type Good response to the Cialis. May work for as long as 72 hours when needed. No chest pains, dizziness or headache. No priapism. Recheck routine labs and refilled medication. - CBC with Differential/Platelet - Comprehensive metabolic panel - tadalafil (CIALIS) 5 MG tablet; Take 1 tablet (5 mg total) by mouth daily as needed for erectile dysfunction.  Dispense: 30 tablet; Refill: 3  5. Need for diphtheria-tetanus-pertussis (Tdap) vaccine - Tdap vaccine greater than or equal to 7yo IM     Vernie Murders, PA  [facility]

## 2019-04-22 ENCOUNTER — Ambulatory Visit: Payer: BC Managed Care – PPO | Admitting: Family Medicine

## 2019-04-22 ENCOUNTER — Other Ambulatory Visit: Payer: Self-pay

## 2019-04-22 ENCOUNTER — Encounter: Payer: Self-pay | Admitting: Family Medicine

## 2019-04-22 VITALS — BP 130/92 | HR 75 | Temp 97.1°F | Resp 16 | Wt 233.0 lb

## 2019-04-22 DIAGNOSIS — E039 Hypothyroidism, unspecified: Secondary | ICD-10-CM

## 2019-04-22 DIAGNOSIS — N529 Male erectile dysfunction, unspecified: Secondary | ICD-10-CM

## 2019-04-22 DIAGNOSIS — I10 Essential (primary) hypertension: Secondary | ICD-10-CM | POA: Diagnosis not present

## 2019-04-22 DIAGNOSIS — Z23 Encounter for immunization: Secondary | ICD-10-CM | POA: Diagnosis not present

## 2019-04-22 DIAGNOSIS — E782 Mixed hyperlipidemia: Secondary | ICD-10-CM | POA: Diagnosis not present

## 2019-04-22 MED ORDER — TADALAFIL 5 MG PO TABS: 5.0000 mg | ORAL_TABLET | Freq: Every day | ORAL | 3 refills | Status: DC | PRN

## 2019-04-22 MED ORDER — LEVOTHYROXINE SODIUM 88 MCG PO TABS: 88.0000 ug | ORAL_TABLET | Freq: Every day | ORAL | 3 refills | Status: DC

## 2019-07-07 ENCOUNTER — Ambulatory Visit: Payer: BC Managed Care – PPO | Attending: Internal Medicine

## 2019-07-07 DIAGNOSIS — Z20822 Contact with and (suspected) exposure to covid-19: Secondary | ICD-10-CM | POA: Diagnosis not present

## 2019-07-08 ENCOUNTER — Telehealth: Payer: Self-pay

## 2019-07-08 ENCOUNTER — Encounter: Payer: Self-pay | Admitting: Family Medicine

## 2019-07-08 ENCOUNTER — Ambulatory Visit (INDEPENDENT_AMBULATORY_CARE_PROVIDER_SITE_OTHER): Payer: BC Managed Care – PPO | Admitting: Family Medicine

## 2019-07-08 DIAGNOSIS — R519 Headache, unspecified: Secondary | ICD-10-CM | POA: Diagnosis not present

## 2019-07-08 DIAGNOSIS — J029 Acute pharyngitis, unspecified: Secondary | ICD-10-CM | POA: Diagnosis not present

## 2019-07-08 LAB — NOVEL CORONAVIRUS, NAA: SARS-CoV-2, NAA: NOT DETECTED

## 2019-07-08 LAB — SARS-COV-2, NAA 2 DAY TAT

## 2019-07-08 MED ORDER — AMOXICILLIN 875 MG PO TABS: 875.0000 mg | ORAL_TABLET | Freq: Two times a day (BID) | ORAL | 0 refills | Status: DC

## 2019-07-08 NOTE — Telephone Encounter (Signed)
Copied from [place] [id number]. Topic: General - Other >> Jul 08, 2019 10:08 AM Leward Quan A wrote: Reason for CRM: Patient called back to reregister for his virtual visit later. He can be reached at Ph# [phone]

## 2019-07-08 NOTE — Progress Notes (Signed)
MyChart Video Visit    Virtual Visit via Video Note   This visit type was conducted due to national recommendations for restrictions regarding the COVID-19 Pandemic (e.g. social distancing) in an effort to limit this patient's exposure and mitigate transmission in our community. This patient is at least at moderate risk for complications without adequate follow up. This format is felt to be most appropriate for this patient at this time. Physical exam was limited by quality of the video and audio technology used for the visit.   Patient location: Home Provider location: Office   Patient: Matthew York   DOB: [birth year]   39 y.o. Male  MRN: [id number] Visit Date: 07/08/2019  Today's healthcare provider: Vernie Murders, PA   Chief Complaint  Patient presents with  . Cough  . Sore Throat   Mertie Moores as a scribe for [employer], [employer].,have documented all relevant documentation on the behalf of [employer], [employer],as directed by  [employer], [employer] while in the presence of [employer], [place].  Subjective    Cough This is a new problem. The current episode started yesterday. The problem has been gradually improving. The problem occurs every few minutes. The cough is non-productive. Associated symptoms include ear pain (pressure), headaches and a sore throat. Pertinent negatives include no fever or rhinorrhea. Exacerbated by: movement makes the headache worse. He has tried nothing for the symptoms. His past medical history is significant for environmental allergies.     Past Medical History:  Diagnosis Date  . Elevated blood pressure    Past Surgical History:  Procedure Laterality Date  . VASECTOMY  2014   Social History   Tobacco Use  . Smoking status: Never Smoker  . Smokeless tobacco: Never Used  Substance Use Topics  . Alcohol use: Yes    Comment: Occasional use  . Drug use: No   Family Status  Relation Name Status  . Mother  Alive  . Father   Alive   No Known Allergies    Medications: Outpatient Medications Prior to Visit  Medication Sig  . Inositol Niacinate (NIACIN FLUSH FREE) 500 MG CAPS Take 1 capsule by mouth daily.  Marland Kitchen levothyroxine (SYNTHROID) 88 MCG tablet Take 1 tablet (88 mcg total) by mouth daily before breakfast.  . metoprolol succinate (TOPROL-XL) 50 MG 24 hr tablet TAKE 1 TABLET (50 MG TOTAL) BY MOUTH DAILY.  . tadalafil (CIALIS) 5 MG tablet Take 1 tablet (5 mg total) by mouth daily as needed for erectile dysfunction.   No facility-administered medications prior to visit.    Review of Systems  Constitutional: Negative for fever.  HENT: Positive for ear pain (pressure) and sore throat. Negative for rhinorrhea.   Respiratory: Positive for cough.   Cardiovascular: Negative.   Musculoskeletal: Negative.   Allergic/Immunologic: Positive for environmental allergies.  Neurological: Positive for headaches.      Objective    There were no vitals taken for this visit.   Physical Exam: WDWN male in no apparent distress.  Head: Normocephalic, atraumatic. Throat: Red posterior pharynx without exudates visible. Neck: Supple, NROM Respiratory: No apparent distress Psych: Normal mood and affect   Assessment & Plan     1. Pharyngitis, unspecified etiology Developed a throbbing headache 2-3 days ago in the back of his head with a clammy sensation. Took Tylenol and improved. Yesterday had some PND, with fatigue, sore throat, cough and sniffles. No fever or itchy eyes. Got a COVID test done that was reported as negative today. Will  treat red throat with Amoxil and may gargle with warm salt water or use lozenges for discomfort. May add Mucinex-DM with antihistamine prn symptoms. Recheck or go back to the Urgent Care Clinic if fever over 100 develops. Increase fluid intake, also. - amoxicillin (AMOXIL) 875 MG tablet; Take 1 tablet (875 mg total) by mouth 2 (two) times daily.  Dispense: 20 tablet; Refill: 0  2.  Nonintractable headache, unspecified chronicity pattern, unspecified headache type Onset with headache and sore throat yesterday. Increase fluid intake and eat three meals a day. No significant headache at the present. No neurologic deficits recognized.   No follow-ups on file.     I discussed the assessment and treatment plan with the patient. The patient was provided an opportunity to ask questions and all were answered. The patient agreed with the plan and demonstrated an understanding of the instructions.   The patient was advised to call back or seek an in-person evaluation if the symptoms worsen or if the condition fails to improve as anticipated.  I provided 16 minutes of non-face-to-face time during this encounter.  Andres Shad, PA, have reviewed all documentation for this visit. The documentation on 07/08/19 for the exam, diagnosis, procedures, and orders are all accurate and complete.   Vernie Murders, [place] [phone] (phone) [phone] (fax)  [place]

## 2019-07-29 ENCOUNTER — Ambulatory Visit (INDEPENDENT_AMBULATORY_CARE_PROVIDER_SITE_OTHER): Payer: BC Managed Care – PPO | Admitting: Family Medicine

## 2019-07-29 ENCOUNTER — Other Ambulatory Visit: Payer: Self-pay

## 2019-07-29 ENCOUNTER — Encounter: Payer: Self-pay | Admitting: Family Medicine

## 2019-07-29 VITALS — BP 147/99 | HR 76 | Temp 97.5°F | Wt 239.4 lb

## 2019-07-29 DIAGNOSIS — R519 Headache, unspecified: Secondary | ICD-10-CM | POA: Diagnosis not present

## 2019-07-29 DIAGNOSIS — M26609 Unspecified temporomandibular joint disorder, unspecified side: Secondary | ICD-10-CM

## 2019-07-29 DIAGNOSIS — E039 Hypothyroidism, unspecified: Secondary | ICD-10-CM | POA: Diagnosis not present

## 2019-07-29 DIAGNOSIS — E782 Mixed hyperlipidemia: Secondary | ICD-10-CM | POA: Diagnosis not present

## 2019-07-29 DIAGNOSIS — I10 Essential (primary) hypertension: Secondary | ICD-10-CM | POA: Diagnosis not present

## 2019-07-29 MED ORDER — METOPROLOL SUCCINATE ER 50 MG PO TB24: ORAL_TABLET | ORAL | 1 refills | Status: DC

## 2019-07-29 NOTE — Progress Notes (Signed)
Established patient visit   Patient: Matthew York   DOB: 11/15/1980   39 y.o. Male  MRN: [id number] Visit Date: 07/29/2019  Today's healthcare provider: Vernie Murders, PA   Chief Complaint  Patient presents with  . Hypertension  . Hyperlipidemia  . Hypothyroidism   I,Latasha Walston,acting as a Education administrator for [employer], [employer].,have documented all relevant documentation on the behalf of [employer], PA,as directed by  [employer], PA while in the presence of [employer], [place].  Subjective    HPI Hypertension, follow-up  BP Readings from Last 3 Encounters:  07/29/19 (!) 147/99  04/22/19 (!) 130/92  12/15/18 140/78   Wt Readings from Last 3 Encounters:  07/29/19 239 lb 6.4 oz (108.6 kg)  04/22/19 233 lb (105.7 kg)  12/15/18 232 lb (105.2 kg)     He was last seen for hypertension 3 months ago.  BP at that visit was 130/92. Management since that visit includes continue Metoprolol Succinate 50 mg qd, restrict salt and caffeine intake and work on some weight loss. Recheck labs  He reports fair compliance with treatment. Paqtient did not have labs done. He is not having side effects.  He is following a Regular diet. He is not exercising. He does not smoke.  Use of agents associated with hypertension: thyroid hormones.   Outside blood pressures are not being checked at home. Symptoms: No chest pain No chest pressure  No palpitations No syncope  No dyspnea No orthopnea  No paroxysmal nocturnal dyspnea No lower extremity edema   Pertinent labs: Lab Results  Component Value Date   CHOL 168 12/18/2018   HDL 30 (L) 12/18/2018   LDLCALC 86 12/18/2018   TRIG 318 (H) 12/18/2018   CHOLHDL 5.6 (H) 12/18/2018   Lab Results  Component Value Date   NA 138 12/18/2018   K 4.3 12/18/2018   CREATININE 1.17 12/18/2018   GFRNONAA 78 12/18/2018   GFRAA 90 12/18/2018   GLUCOSE 83 12/18/2018     The 10-year ASCVD risk score Mikey Bussing DC Jr., et al., 2013) is: 2.5%    --------------------------------------------------------------------------------------------------- Hypothyroid, follow-up  Lab Results  Component Value Date   TSH 3.910 12/18/2018   TSH 4.18 05/15/1978   Wt Readings from Last 3 Encounters:  07/29/19 239 lb 6.4 oz (108.6 kg)  04/22/19 233 lb (105.7 kg)  12/15/18 232 lb (105.2 kg)    He was last seen for hypothyroid 3 months ago.  Management since that visit includes no change. Recheck labs. He reports fair compliance with treatment. Did not have labs done. He is not having side effects.   Symptoms: No change in energy level No constipation  No diarrhea No heat / cold intolerance  No nervousness No palpitations  No weight changes    ----------------------------------------------------------------------------------------- Lipid/Cholesterol, Follow-up  Last lipid panel Other pertinent labs  Lab Results  Component Value Date   CHOL 168 12/18/2018   HDL 30 (L) 12/18/2018   LDLCALC 86 12/18/2018   TRIG 318 (H) 12/18/2018   CHOLHDL 5.6 (H) 12/18/2018   Lab Results  Component Value Date   ALT 24 12/18/2018   AST 19 12/18/2018   PLT 249 12/18/2018   TSH 3.910 12/18/2018     He was last seen for this 3 months ago.  Management since that visit includes continue low fat diet and regular exercise.Recheck labs He reports fair compliance with treatment. Did not have labs done. He is not having side effects.   Symptoms: No chest pain  No chest pressure/discomfort  No dyspnea No lower extremity edema  No numbness or tingling of extremity No orthopnea  No palpitations No paroxysmal nocturnal dyspnea  No speech difficulty No syncope   Current diet: well balanced Current exercise: none  The 10-year ASCVD risk score Mikey Bussing DC Brooke Bonito., et al., 2013) is: 2.5%  --------------------------------------------------------------------------------------------------- Past Medical History:  Diagnosis Date  . Elevated blood pressure     Social History   Tobacco Use  . Smoking status: Never Smoker  . Smokeless tobacco: Never Used  Substance Use Topics  . Alcohol use: Yes    Comment: Occasional use  . Drug use: No   Family Status  Relation Name Status  . Mother  Alive  . Father  Alive   No Known Allergies     Medications: Outpatient Medications Prior to Visit  Medication Sig  . Inositol Niacinate (NIACIN FLUSH FREE) 500 MG CAPS Take 1 capsule by mouth daily.  Marland Kitchen levothyroxine (SYNTHROID) 88 MCG tablet Take 1 tablet (88 mcg total) by mouth daily before breakfast.  . tadalafil (CIALIS) 5 MG tablet Take 1 tablet (5 mg total) by mouth daily as needed for erectile dysfunction.  . [DISCONTINUED] metoprolol succinate (TOPROL-XL) 50 MG 24 hr tablet TAKE 1 TABLET (50 MG TOTAL) BY MOUTH DAILY.  . [DISCONTINUED] amoxicillin (AMOXIL) 875 MG tablet Take 1 tablet (875 mg total) by mouth 2 (two) times daily.   No facility-administered medications prior to visit.    Review of Systems  Constitutional: Negative.   Eyes: Positive for photophobia.  Respiratory: Negative.   Cardiovascular: Negative.   Gastrointestinal: Negative for nausea.  Musculoskeletal: Negative.   Neurological: Positive for headaches.      Objective    BP (!) 147/99 (BP Location: Right Arm, Patient Position: Sitting, Cuff Size: Large)   Pulse 76   Temp (!) 97.5 F (36.4 C) (Temporal)   Wt 239 lb 6.4 oz (108.6 kg)   BMI 31.59 kg/m    Physical Exam   General: Appearance:    Obese male in no acute distress  Eyes:    PERRL, conjunctiva/corneas clear, EOM's intact       Lungs:     Clear to auscultation bilaterally, respirations unlabored  Heart:    Normal heart rate. Normal rhythm. No murmurs, rubs, or gallops.   MS:   All extremities are intact.   Neurologic:   Awake, alert, oriented x 3. No apparent focal neurological           defect.        Assessment & Plan    1. Essential hypertension Continue current medications a this time. Still  using a significant amount of caffeine and not restricting salt very well. Did not get follow up labs 3 months ago. Also, gained 6 lbs since the last office visit. Recheck metabolic panel with CBC. May need additional antihypertensive.  - CBC with Differential - Comprehensive Metabolic Panel (CMET) - metoprolol succinate (TOPROL-XL) 50 MG 24 hr tablet; TAKE 1 TABLET (50 MG TOTAL) BY MOUTH DAILY.  Dispense: 90 tablet; Refill: 1  2. Hypothyroidism, unspecified type Previously well controlled Continue Synthroid at current dose of 88 mcg qd. Recheck TSH and adjust Synthroid as indicated   - TSH - T4  3. Mixed hyperlipidemia Previously well controlled Continue statin and get labs that were not drawn 3 months ago. Repeat FLP and CMP  - Lipid Profile  4. Nonintractable episodic headache, unspecified headache type   5. TMJ (temporomandibular joint disorder)  Return in about 3 months (around 10/29/2019).        Vernie Murders, [place] [phone] (phone) [phone] (fax)  [place]

## 2019-07-29 NOTE — Patient Instructions (Addendum)
General Headache Without Cause °A headache is pain or discomfort that is felt around the head or neck area. There are many causes and types of headaches. In some cases, the cause may not be found. °Follow these instructions at home: °Watch your condition for any changes. Let your doctor know about them. Take these steps to help with your condition: °Managing pain ° °  ° °· Take over-the-counter and prescription medicines only as told by your doctor. °· Lie down in a dark, quiet room when you have a headache. °· If told, put ice on your head and neck area: °? Put ice in a plastic bag. °? Place a towel between your skin and the bag. °? Leave the ice on for 20 minutes, 2-3 times per day. °· If told, put heat on the affected area. Use the heat source that your doctor recommends, such as a moist heat pack or a heating pad. °? Place a towel between your skin and the heat source. °? Leave the heat on for 20-30 minutes. °? Remove the heat if your skin turns bright red. This is very important if you are unable to feel pain, heat, or cold. You may have a greater risk of getting burned. °· Keep lights dim if bright lights bother you or make your headaches worse. °Eating and drinking °· Eat meals on a regular schedule. °· If you drink alcohol: °? Limit how much you use to: °§ 0-1 drink a day for women. °§ 0-2 drinks a day for men. °? Be aware of how much alcohol is in your drink. In the [place] one drink equals one 12 oz bottle of beer (355 mL), one 5 oz glass of wine (148 mL), or one 1½ oz glass of hard liquor (44 mL). °· Stop drinking caffeine, or reduce how much caffeine you drink. °General instructions ° °· Keep a journal to find out if certain things bring on headaches. For example, write down: °? What you eat and drink. °? How much sleep you get. °? Any change to your diet or medicines. °· Get a massage or try other ways to relax. °· Limit stress. °· Sit up straight. Do not tighten (tense) your muscles. °· Do not use any  products that contain nicotine or tobacco. This includes cigarettes, e-cigarettes, and chewing tobacco. If you need help quitting, ask your doctor. °· Exercise regularly as told by your doctor. °· Get enough sleep. This often means 7-9 hours of sleep each night. °· Keep all follow-up visits as told by your doctor. This is important. °Contact a doctor if: °· Your symptoms are not helped by medicine. °· You have a headache that feels different than the other headaches. °· You feel sick to your stomach (nauseous) or you throw up (vomit). °· You have a fever. °Get help right away if: °· Your headache gets very bad quickly. °· Your headache gets worse after a lot of physical activity. °· You keep throwing up. °· You have a stiff neck. °· You have trouble seeing. °· You have trouble speaking. °· You have pain in the eye or ear. °· Your muscles are weak or you lose muscle control. °· You lose your balance or have trouble walking. °· You feel like you will pass out (faint) or you pass out. °· You are mixed up (confused). °· You have a seizure. °Summary °· A headache is pain or discomfort that is felt around the head or neck area. °· There are many causes and   types of headaches. In some cases, the cause may not be found.  Keep a journal to help find out what causes your headaches. Watch your condition for any changes. Let your doctor know about them.  Contact a doctor if you have a headache that is different from usual, or if your headache is not helped by medicine.  Get help right away if your headache gets very bad, you throw up, you have trouble seeing, you lose your balance, or you have a seizure. This information is not intended to replace advice given to you by your health care provider. Make sure you discuss any questions you have with your health care provider. Document Revised: 08/25/2017 Document Reviewed: 08/25/2017 Elsevier Patient Education  [place].  Jaw Dislocation  A jaw dislocation is  when the joint that connects the jaw bones (temporomandibular joint) moves out of place (gets dislocated). This can be very painful. Your jaw must be moved back into place by your doctor (manual reduction). What are the causes? Common causes of this condition include:  A hard, direct hit or injury (trauma) to the jaw.  Opening the mouth too wide. This can be caused by: ? Eating. ? Yawning. ? Throwing up (vomiting). ? Having a dental procedure. ? Receiving medicine by mouth to make you fall asleep (general anesthetic). What increases the risk? You are more likely to get this condition if:  Your jaw has moved out of place in the past.  You play contact sports.  You have a jaw that is loose or can move beyond the normal range. What are the signs or symptoms? Symptoms of this condition may include:  Very bad pain.  Not being able to move your jaw or close your mouth all the way.  Feeling that your teeth do not line up as they normally do when you bite.  Drooling.  Trouble speaking or swallowing. How is this treated? The most common treatment for this condition is to have your doctor move your jaw back into place. Your doctor may then wrap a bandage around your head and jaw. This will help to hold your jaw in place while it heals. Your doctor may also recommend:  Medicines to reduce pain and swelling.  Medicines to relax your muscles.  A neck brace to support your jaw. Follow these instructions at home: Managing pain, stiffness, and swelling   If told, put ice on the injured area: ? Put ice in a plastic bag. ? Place a towel between your skin and the bag. ? Leave the ice on for 20 minutes, 2-3 times a day. If you have a neck brace or jaw bandage:  Wear the brace or bandage as told by your doctor. Remove it only as told by your doctor.  Keep the brace or bandage clean.  If the brace or bandage is not waterproof: ? Do not let it get wet. ? Cover it with a watertight  covering when you take a bath or shower. Eating and drinking  Follow instructions from your doctor about what you can eat and drink while your jaw is healing. You may be asked to: ? Eat soft foods. ? Eat liquid food. ? Eat food that has been cut into small pieces. Activity  Do not open your mouth wide until your doctor says that you can.  Rest your jaw as told by your doctor.  Avoid activities that are like the one that caused your injury.  When you sneeze or yawn, put  a hand under your jaw. This will keep your mouth from opening too wide. General instructions  Take over-the-counter and prescription medicines only as told by your doctor.  Do not take baths, swim, or use a hot tub until your doctor approves. Ask your doctor if you may take showers. You may only be allowed to take sponge baths.  Keep all follow-up visits as told by your doctor. This is important. Contact a doctor if:  You have pain that gets worse, and medicines do not help your pain. Get help right away if:  Your jaw moves out of place again.  You have trouble breathing. Summary  A jaw dislocation is when the joint that connects the jaw bones (temporomandibular joint) moves out of place.  Jaw dislocation can be very painful.  The most common treatment for this condition is for your jaw to be moved back into place by your doctor (manual reduction). This information is not intended to replace advice given to you by your health care provider. Make sure you discuss any questions you have with your health care provider. Document Revised: 12/03/2017 Document Reviewed: 12/03/2017 Elsevier Patient Education  [place].

## 2019-07-30 DIAGNOSIS — E039 Hypothyroidism, unspecified: Secondary | ICD-10-CM | POA: Diagnosis not present

## 2019-07-30 DIAGNOSIS — I10 Essential (primary) hypertension: Secondary | ICD-10-CM | POA: Diagnosis not present

## 2019-07-30 DIAGNOSIS — E782 Mixed hyperlipidemia: Secondary | ICD-10-CM | POA: Diagnosis not present

## 2019-07-31 LAB — CBC WITH DIFFERENTIAL/PLATELET
Basophils Absolute: 0.1 10*3/uL (ref 0.0–0.2)
Basos: 1 %
EOS (ABSOLUTE): 0.1 10*3/uL (ref 0.0–0.4)
Eos: 3 %
Hematocrit: 48.9 % (ref 37.5–51.0)
Hemoglobin: 16.7 g/dL (ref 13.0–17.7)
Immature Grans (Abs): 0 10*3/uL (ref 0.0–0.1)
Immature Granulocytes: 1 %
Lymphocytes Absolute: 2 10*3/uL (ref 0.7–3.1)
Lymphs: 37 %
MCH: 28.6 pg (ref 26.6–33.0)
MCHC: 34.2 g/dL (ref 31.5–35.7)
MCV: 84 fL (ref 79–97)
Monocytes Absolute: 0.4 10*3/uL (ref 0.1–0.9)
Monocytes: 8 %
Neutrophils Absolute: 2.7 10*3/uL (ref 1.4–7.0)
Neutrophils: 50 %
Platelets: 274 10*3/uL (ref 150–450)
RBC: 5.84 x10E6/uL — ABNORMAL HIGH (ref 4.14–5.80)
RDW: 12.9 % (ref 11.6–15.4)
WBC: 5.4 10*3/uL (ref 3.4–10.8)

## 2019-07-31 LAB — COMPREHENSIVE METABOLIC PANEL
ALT: 30 IU/L (ref 0–44)
AST: 18 IU/L (ref 0–40)
Albumin/Globulin Ratio: 2 (ref 1.2–2.2)
Albumin: 4.6 g/dL (ref 4.0–5.0)
Alkaline Phosphatase: 61 IU/L (ref 48–121)
BUN/Creatinine Ratio: 11 (ref 9–20)
BUN: 12 mg/dL (ref 6–24)
Bilirubin Total: 1.2 mg/dL (ref 0.0–1.2)
CO2: 20 mmol/L (ref 20–29)
Calcium: 9.3 mg/dL (ref 8.7–10.2)
Chloride: 103 mmol/L (ref 96–106)
Creatinine, Ser: 1.11 mg/dL (ref 0.76–1.27)
GFR calc Af Amer: 96 mL/min/{1.73_m2} (ref >59)
GFR calc non Af Amer: 83 mL/min/{1.73_m2} (ref >59)
Globulin, Total: 2.3 g/dL (ref 1.5–4.5)
Glucose: 81 mg/dL (ref 65–99)
Potassium: 4.2 mmol/L (ref 3.5–5.2)
Sodium: 140 mmol/L (ref 134–144)
Total Protein: 6.9 g/dL (ref 6.0–8.5)

## 2019-07-31 LAB — LIPID PANEL
Chol/HDL Ratio: 6.4 ratio — ABNORMAL HIGH (ref 0.0–5.0)
Cholesterol, Total: 205 mg/dL — ABNORMAL HIGH (ref 100–199)
HDL: 32 mg/dL — ABNORMAL LOW (ref >39)
LDL Chol Calc (NIH): 119 mg/dL — ABNORMAL HIGH (ref 0–99)
Triglycerides: 305 mg/dL — ABNORMAL HIGH (ref 0–149)
VLDL Cholesterol Cal: 54 mg/dL — ABNORMAL HIGH (ref 5–40)

## 2019-07-31 LAB — T4: T4, Total: 7.1 ug/dL (ref 4.5–12.0)

## 2019-07-31 LAB — TSH: TSH: 4.03 u[IU]/mL (ref 0.450–4.500)

## 2019-08-02 ENCOUNTER — Telehealth: Payer: Self-pay

## 2019-08-02 DIAGNOSIS — E782 Mixed hyperlipidemia: Secondary | ICD-10-CM

## 2019-08-02 MED ORDER — ATORVASTATIN CALCIUM 40 MG PO TABS: 40.0000 mg | ORAL_TABLET | Freq: Every day | ORAL | 3 refills | Status: DC

## 2019-08-02 NOTE — Telephone Encounter (Signed)
-----   Message from Margo Common, [place] sent at 08/02/2019  1:04 PM EDT ----- All blood tests are normal except cholesterol and triglycerides are high. Will need Atorvastatin 40 mg qd #90 and 3 RF. Recheck progress with this medication and work on weight loss with regular execise in 3 months.

## 2019-08-02 NOTE — Telephone Encounter (Signed)
Patient advised as below. Patient verbalizes understanding and is in agreement with treatment plan.  

## 2019-09-22 DIAGNOSIS — M542 Cervicalgia: Secondary | ICD-10-CM | POA: Diagnosis not present

## 2019-09-22 DIAGNOSIS — R519 Headache, unspecified: Secondary | ICD-10-CM | POA: Diagnosis not present

## 2019-09-22 DIAGNOSIS — M9903 Segmental and somatic dysfunction of lumbar region: Secondary | ICD-10-CM | POA: Diagnosis not present

## 2019-09-22 DIAGNOSIS — M9901 Segmental and somatic dysfunction of cervical region: Secondary | ICD-10-CM | POA: Diagnosis not present

## 2019-09-23 DIAGNOSIS — M542 Cervicalgia: Secondary | ICD-10-CM | POA: Diagnosis not present

## 2019-09-23 DIAGNOSIS — M9901 Segmental and somatic dysfunction of cervical region: Secondary | ICD-10-CM | POA: Diagnosis not present

## 2019-09-23 DIAGNOSIS — M9903 Segmental and somatic dysfunction of lumbar region: Secondary | ICD-10-CM | POA: Diagnosis not present

## 2019-09-23 DIAGNOSIS — R519 Headache, unspecified: Secondary | ICD-10-CM | POA: Diagnosis not present

## 2019-10-04 DIAGNOSIS — M9901 Segmental and somatic dysfunction of cervical region: Secondary | ICD-10-CM | POA: Diagnosis not present

## 2019-10-04 DIAGNOSIS — R519 Headache, unspecified: Secondary | ICD-10-CM | POA: Diagnosis not present

## 2019-10-04 DIAGNOSIS — M9903 Segmental and somatic dysfunction of lumbar region: Secondary | ICD-10-CM | POA: Diagnosis not present

## 2019-10-04 DIAGNOSIS — M542 Cervicalgia: Secondary | ICD-10-CM | POA: Diagnosis not present

## 2019-10-05 DIAGNOSIS — M9903 Segmental and somatic dysfunction of lumbar region: Secondary | ICD-10-CM | POA: Diagnosis not present

## 2019-10-05 DIAGNOSIS — M542 Cervicalgia: Secondary | ICD-10-CM | POA: Diagnosis not present

## 2019-10-05 DIAGNOSIS — R519 Headache, unspecified: Secondary | ICD-10-CM | POA: Diagnosis not present

## 2019-10-05 DIAGNOSIS — M9901 Segmental and somatic dysfunction of cervical region: Secondary | ICD-10-CM | POA: Diagnosis not present

## 2019-10-06 DIAGNOSIS — M9901 Segmental and somatic dysfunction of cervical region: Secondary | ICD-10-CM | POA: Diagnosis not present

## 2019-10-06 DIAGNOSIS — M542 Cervicalgia: Secondary | ICD-10-CM | POA: Diagnosis not present

## 2019-10-06 DIAGNOSIS — R519 Headache, unspecified: Secondary | ICD-10-CM | POA: Diagnosis not present

## 2019-10-06 DIAGNOSIS — M9903 Segmental and somatic dysfunction of lumbar region: Secondary | ICD-10-CM | POA: Diagnosis not present

## 2019-10-11 DIAGNOSIS — R519 Headache, unspecified: Secondary | ICD-10-CM | POA: Diagnosis not present

## 2019-10-11 DIAGNOSIS — M9901 Segmental and somatic dysfunction of cervical region: Secondary | ICD-10-CM | POA: Diagnosis not present

## 2019-10-11 DIAGNOSIS — M542 Cervicalgia: Secondary | ICD-10-CM | POA: Diagnosis not present

## 2019-10-11 DIAGNOSIS — M9903 Segmental and somatic dysfunction of lumbar region: Secondary | ICD-10-CM | POA: Diagnosis not present

## 2019-10-13 DIAGNOSIS — M9901 Segmental and somatic dysfunction of cervical region: Secondary | ICD-10-CM | POA: Diagnosis not present

## 2019-10-13 DIAGNOSIS — M9903 Segmental and somatic dysfunction of lumbar region: Secondary | ICD-10-CM | POA: Diagnosis not present

## 2019-10-13 DIAGNOSIS — R519 Headache, unspecified: Secondary | ICD-10-CM | POA: Diagnosis not present

## 2019-10-13 DIAGNOSIS — M542 Cervicalgia: Secondary | ICD-10-CM | POA: Diagnosis not present

## 2019-10-14 DIAGNOSIS — M542 Cervicalgia: Secondary | ICD-10-CM | POA: Diagnosis not present

## 2019-10-14 DIAGNOSIS — R519 Headache, unspecified: Secondary | ICD-10-CM | POA: Diagnosis not present

## 2019-10-14 DIAGNOSIS — M9903 Segmental and somatic dysfunction of lumbar region: Secondary | ICD-10-CM | POA: Diagnosis not present

## 2019-10-14 DIAGNOSIS — M9901 Segmental and somatic dysfunction of cervical region: Secondary | ICD-10-CM | POA: Diagnosis not present

## 2019-10-18 DIAGNOSIS — M9903 Segmental and somatic dysfunction of lumbar region: Secondary | ICD-10-CM | POA: Diagnosis not present

## 2019-10-18 DIAGNOSIS — M542 Cervicalgia: Secondary | ICD-10-CM | POA: Diagnosis not present

## 2019-10-18 DIAGNOSIS — R519 Headache, unspecified: Secondary | ICD-10-CM | POA: Diagnosis not present

## 2019-10-18 DIAGNOSIS — M9901 Segmental and somatic dysfunction of cervical region: Secondary | ICD-10-CM | POA: Diagnosis not present

## 2019-10-20 DIAGNOSIS — M9903 Segmental and somatic dysfunction of lumbar region: Secondary | ICD-10-CM | POA: Diagnosis not present

## 2019-10-20 DIAGNOSIS — M9901 Segmental and somatic dysfunction of cervical region: Secondary | ICD-10-CM | POA: Diagnosis not present

## 2019-10-20 DIAGNOSIS — M542 Cervicalgia: Secondary | ICD-10-CM | POA: Diagnosis not present

## 2019-10-20 DIAGNOSIS — R519 Headache, unspecified: Secondary | ICD-10-CM | POA: Diagnosis not present

## 2019-10-22 DIAGNOSIS — R519 Headache, unspecified: Secondary | ICD-10-CM | POA: Diagnosis not present

## 2019-10-22 DIAGNOSIS — M542 Cervicalgia: Secondary | ICD-10-CM | POA: Diagnosis not present

## 2019-10-22 DIAGNOSIS — M9903 Segmental and somatic dysfunction of lumbar region: Secondary | ICD-10-CM | POA: Diagnosis not present

## 2019-10-22 DIAGNOSIS — M9901 Segmental and somatic dysfunction of cervical region: Secondary | ICD-10-CM | POA: Diagnosis not present

## 2019-10-26 DIAGNOSIS — M9901 Segmental and somatic dysfunction of cervical region: Secondary | ICD-10-CM | POA: Diagnosis not present

## 2019-10-26 DIAGNOSIS — M9903 Segmental and somatic dysfunction of lumbar region: Secondary | ICD-10-CM | POA: Diagnosis not present

## 2019-10-26 DIAGNOSIS — M542 Cervicalgia: Secondary | ICD-10-CM | POA: Diagnosis not present

## 2019-10-26 DIAGNOSIS — R519 Headache, unspecified: Secondary | ICD-10-CM | POA: Diagnosis not present

## 2019-10-27 DIAGNOSIS — R519 Headache, unspecified: Secondary | ICD-10-CM | POA: Diagnosis not present

## 2019-10-27 DIAGNOSIS — M542 Cervicalgia: Secondary | ICD-10-CM | POA: Diagnosis not present

## 2019-10-27 DIAGNOSIS — M9903 Segmental and somatic dysfunction of lumbar region: Secondary | ICD-10-CM | POA: Diagnosis not present

## 2019-10-27 DIAGNOSIS — M9901 Segmental and somatic dysfunction of cervical region: Secondary | ICD-10-CM | POA: Diagnosis not present

## 2019-11-01 DIAGNOSIS — R519 Headache, unspecified: Secondary | ICD-10-CM | POA: Diagnosis not present

## 2019-11-01 DIAGNOSIS — M9903 Segmental and somatic dysfunction of lumbar region: Secondary | ICD-10-CM | POA: Diagnosis not present

## 2019-11-01 DIAGNOSIS — M542 Cervicalgia: Secondary | ICD-10-CM | POA: Diagnosis not present

## 2019-11-01 DIAGNOSIS — M9901 Segmental and somatic dysfunction of cervical region: Secondary | ICD-10-CM | POA: Diagnosis not present

## 2019-11-04 ENCOUNTER — Other Ambulatory Visit: Payer: Self-pay

## 2019-11-04 ENCOUNTER — Encounter: Payer: Self-pay | Admitting: Family Medicine

## 2019-11-04 ENCOUNTER — Ambulatory Visit (INDEPENDENT_AMBULATORY_CARE_PROVIDER_SITE_OTHER): Payer: BC Managed Care – PPO | Admitting: Family Medicine

## 2019-11-04 VITALS — BP 137/93 | HR 73 | Temp 98.5°F | Resp 16 | Wt 234.0 lb

## 2019-11-04 DIAGNOSIS — E782 Mixed hyperlipidemia: Secondary | ICD-10-CM

## 2019-11-04 DIAGNOSIS — I10 Essential (primary) hypertension: Secondary | ICD-10-CM | POA: Diagnosis not present

## 2019-11-04 DIAGNOSIS — E039 Hypothyroidism, unspecified: Secondary | ICD-10-CM

## 2019-11-04 DIAGNOSIS — M9901 Segmental and somatic dysfunction of cervical region: Secondary | ICD-10-CM | POA: Diagnosis not present

## 2019-11-04 DIAGNOSIS — M542 Cervicalgia: Secondary | ICD-10-CM | POA: Diagnosis not present

## 2019-11-04 DIAGNOSIS — R519 Headache, unspecified: Secondary | ICD-10-CM | POA: Diagnosis not present

## 2019-11-04 DIAGNOSIS — M9903 Segmental and somatic dysfunction of lumbar region: Secondary | ICD-10-CM | POA: Diagnosis not present

## 2019-11-04 NOTE — Progress Notes (Signed)
Established patient visit   Patient: Matthew York   DOB: 04/21/1980   39 y.o. Male  MRN: [id number] Visit Date: 11/04/2019  Today's healthcare provider: Vernie Murders, PA   Chief Complaint  Patient presents with   Hyperlipidemia   Hypertension   Hypothyroidism   Subjective    HPI  Hypertension, follow-up  BP Readings from Last 3 Encounters:  11/04/19 (!) 137/93  07/29/19 (!) 147/99  04/22/19 (!) 130/92   Wt Readings from Last 3 Encounters:  11/04/19 234 lb (106.1 kg)  07/29/19 239 lb 6.4 oz (108.6 kg)  04/22/19 233 lb (105.7 kg)     He was last seen for hypertension 3 months ago.  BP at that visit was 147/99. Management since that visit includes advising patient to work on weight loss and regular exercise.  He reports good compliance with treatment. He is not having side effects.  He is following a Regular diet. He is exercising. He does not smoke.  Use of agents associated with hypertension: thyroid hormones.   Outside blood pressures are not checked. Symptoms: No chest pain No chest pressure  No palpitations No syncope  No dyspnea No orthopnea  No paroxysmal nocturnal dyspnea No lower extremity edema   Pertinent labs: Lab Results  Component Value Date   CHOL 205 (H) 07/30/2019   HDL 32 (L) 07/30/2019   LDLCALC 119 (H) 07/30/2019   TRIG 305 (H) 07/30/2019   CHOLHDL 6.4 (H) 07/30/2019   Lab Results  Component Value Date   NA 140 07/30/2019   K 4.2 07/30/2019   CREATININE 1.11 07/30/2019   GFRNONAA 83 07/30/2019   GFRAA 96 07/30/2019   GLUCOSE 81 07/30/2019     The 10-year ASCVD risk score Mikey Bussing DC Jr., et al., 2013) is: 3.3%   ---------------------------------------------------------------------------------------------------  Lipid/Cholesterol, Follow-up  Last lipid panel Other pertinent labs  Lab Results  Component Value Date   CHOL 205 (H) 07/30/2019   HDL 32 (L) 07/30/2019   LDLCALC 119 (H) 07/30/2019   TRIG 305 (H) 07/30/2019    CHOLHDL 6.4 (H) 07/30/2019   Lab Results  Component Value Date   ALT 30 07/30/2019   AST 18 07/30/2019   PLT 274 07/30/2019   TSH 4.030 07/30/2019     He was last seen for this 3 months ago.  Management since that visit includes adding Atorvastatin 40mg  daily.  He reports good compliance with treatment. He is not having side effects.   Symptoms: No chest pain No chest pressure/discomfort  No dyspnea No lower extremity edema  No numbness or tingling of extremity No orthopnea  No palpitations No paroxysmal nocturnal dyspnea  No speech difficulty No syncope   Current diet: in general, an "unhealthy" diet Current exercise: walking  The 10-year ASCVD risk score Mikey Bussing DC Jr., et al., 2013) is: 3.3%  ---------------------------------------------------------------------------------------------------  Hypothyroid, follow-up  Lab Results  Component Value Date   TSH 4.030 07/30/2019   TSH 3.910 12/18/2018   TSH 4.18 06-09-1980   T4TOTAL 7.1 07/30/2019   Wt Readings from Last 3 Encounters:  11/04/19 234 lb (106.1 kg)  07/29/19 239 lb 6.4 oz (108.6 kg)  04/22/19 233 lb (105.7 kg)    He was last seen for hypothyroid 3 months ago.  Management since that visit includes continuing same medication. He reports good compliance with treatment. He is not having side effects.   Symptoms: No change in energy level No constipation  No diarrhea No heat / cold intolerance  No nervousness  No palpitations  No weight changes    -----------------------------------------------------------------------------------------   Past Medical History:  Diagnosis Date   Elevated blood pressure    Past Surgical History:  Procedure Laterality Date   VASECTOMY  2014   Social History   Tobacco Use   Smoking status: Never Smoker   Smokeless tobacco: Never Used  Substance Use Topics   Alcohol use: Yes    Comment: Occasional use   Drug use: No   Family Status  Relation Name Status    Mother  Alive   Father  Alive   No Known Allergies     Medications: Outpatient Medications Prior to Visit  Medication Sig   atorvastatin (LIPITOR) 40 MG tablet Take 1 tablet (40 mg total) by mouth daily.   Inositol Niacinate (NIACIN FLUSH FREE) 500 MG CAPS Take 1 capsule by mouth daily.   levothyroxine (SYNTHROID) 88 MCG tablet Take 1 tablet (88 mcg total) by mouth daily before breakfast.   metoprolol succinate (TOPROL-XL) 50 MG 24 hr tablet TAKE 1 TABLET (50 MG TOTAL) BY MOUTH DAILY.   tadalafil (CIALIS) 5 MG tablet Take 1 tablet (5 mg total) by mouth daily as needed for erectile dysfunction.   No facility-administered medications prior to visit.    Review of Systems  Constitutional: Negative for appetite change, chills and fever.  Respiratory: Negative for chest tightness, shortness of breath and wheezing.   Cardiovascular: Negative for chest pain and palpitations.  Gastrointestinal: Negative for abdominal pain, nausea and vomiting.    Last CBC Lab Results  Component Value Date   WBC 5.4 07/30/2019   HGB 16.7 07/30/2019   HCT 48.9 07/30/2019   MCV 84 07/30/2019   MCH 28.6 07/30/2019   RDW 12.9 07/30/2019   PLT 274 33/82/5053   Last metabolic panel Lab Results  Component Value Date   GLUCOSE 81 07/30/2019   NA 140 07/30/2019   K 4.2 07/30/2019   CL 103 07/30/2019   CO2 20 07/30/2019   BUN 12 07/30/2019   CREATININE 1.11 07/30/2019   GFRNONAA 83 07/30/2019   GFRAA 96 07/30/2019   CALCIUM 9.3 07/30/2019   PROT 6.9 07/30/2019   ALBUMIN 4.6 07/30/2019   LABGLOB 2.3 07/30/2019   AGRATIO 2.0 07/30/2019   BILITOT 1.2 07/30/2019   ALKPHOS 61 07/30/2019   AST 18 07/30/2019   ALT 30 07/30/2019   Last lipids Lab Results  Component Value Date   CHOL 205 (H) 07/30/2019   HDL 32 (L) 07/30/2019   LDLCALC 119 (H) 07/30/2019   TRIG 305 (H) 07/30/2019   CHOLHDL 6.4 (H) 07/30/2019      Objective    BP (!) 137/93 (BP Location: Left Arm, Patient Position: Sitting,  Cuff Size: Large)    Pulse 73    Temp 98.5 F (36.9 C) (Oral)    Resp 16    Wt 234 lb (106.1 kg)    BMI 30.87 kg/m  BP Readings from Last 3 Encounters:  11/04/19 (!) 137/93  07/29/19 (!) 147/99  04/22/19 (!) 130/92   Wt Readings from Last 3 Encounters:  11/04/19 234 lb (106.1 kg)  07/29/19 239 lb 6.4 oz (108.6 kg)  04/22/19 233 lb (105.7 kg)     Physical Exam Constitutional:      General: He is not in acute distress.    Appearance: He is well-developed.  HENT:     Head: Normocephalic and atraumatic.     Right Ear: Hearing and tympanic membrane normal.     Left Ear:  Hearing and tympanic membrane normal.     Nose: Nose normal.     Mouth/Throat:     Pharynx: Oropharynx is clear.  Eyes:     General: Lids are normal. No scleral icterus.       Right eye: No discharge.        Left eye: No discharge.     Conjunctiva/sclera: Conjunctivae normal.  Cardiovascular:     Rate and Rhythm: Normal rate and regular rhythm.     Pulses: Normal pulses.     Heart sounds: Normal heart sounds.  Pulmonary:     Effort: Pulmonary effort is normal. No respiratory distress.     Breath sounds: Normal breath sounds.  Abdominal:     General: Bowel sounds are normal.     Palpations: Abdomen is soft.  Musculoskeletal:        General: Normal range of motion.     Cervical back: Neck supple.  Skin:    Findings: No lesion or rash.  Neurological:     Mental Status: He is alert and oriented to person, place, and time.  Psychiatric:        Speech: Speech normal.        Behavior: Behavior normal.        Thought Content: Thought content normal.     No results found for any visits on 11/04/19.  Assessment & Plan     1. Mixed hyperlipidemia Tolerating the Atorvastatin 40 mg qd and lost 5 lbs since last OV in June. Encouraged to follow low fat diet and exercise regularly. Recheck labs. - CBC with Differential/Platelet - Comprehensive metabolic panel - TSH - Lipid panel  2. Essential  hypertension Still taking Toprol-XL 50 mg qd without side effects. Restrict caffeine and salt intake. Recheck labs and monitor BP. - CBC with Differential/Platelet - Comprehensive metabolic panel - TSH - Lipid panel  3. Hypothyroidism, unspecified type With BP borderline today, patient concerned Levothyroxine may need adjustments. Check labs. - CBC with Differential/Platelet - Comprehensive metabolic panel - TSH   No follow-ups on file.      Andres Shad, PA, have reviewed all documentation for this visit. The documentation on 11/04/19 for the exam, diagnosis, procedures, and orders are all accurate and complete.    Vernie Murders, [place] [phone] (phone) [phone] (fax)  [place]

## 2019-11-05 ENCOUNTER — Telehealth: Payer: Self-pay

## 2019-11-05 LAB — CBC WITH DIFFERENTIAL/PLATELET
Basophils Absolute: 0 10*3/uL (ref 0.0–0.2)
Basos: 1 %
EOS (ABSOLUTE): 0.1 10*3/uL (ref 0.0–0.4)
Eos: 2 %
Hematocrit: 48 % (ref 37.5–51.0)
Hemoglobin: 16.4 g/dL (ref 13.0–17.7)
Immature Grans (Abs): 0 10*3/uL (ref 0.0–0.1)
Immature Granulocytes: 0 %
Lymphocytes Absolute: 2.1 10*3/uL (ref 0.7–3.1)
Lymphs: 40 %
MCH: 28.6 pg (ref 26.6–33.0)
MCHC: 34.2 g/dL (ref 31.5–35.7)
MCV: 84 fL (ref 79–97)
Monocytes Absolute: 0.4 10*3/uL (ref 0.1–0.9)
Monocytes: 7 %
Neutrophils Absolute: 2.7 10*3/uL (ref 1.4–7.0)
Neutrophils: 50 %
Platelets: 247 10*3/uL (ref 150–450)
RBC: 5.73 x10E6/uL (ref 4.14–5.80)
RDW: 12.5 % (ref 11.6–15.4)
WBC: 5.3 10*3/uL (ref 3.4–10.8)

## 2019-11-05 LAB — COMPREHENSIVE METABOLIC PANEL
ALT: 32 IU/L (ref 0–44)
AST: 18 IU/L (ref 0–40)
Albumin/Globulin Ratio: 1.8 (ref 1.2–2.2)
Albumin: 4.5 g/dL (ref 4.0–5.0)
Alkaline Phosphatase: 68 IU/L (ref 44–121)
BUN/Creatinine Ratio: 13 (ref 9–20)
BUN: 14 mg/dL (ref 6–24)
Bilirubin Total: 1.7 mg/dL — ABNORMAL HIGH (ref 0.0–1.2)
CO2: 22 mmol/L (ref 20–29)
Calcium: 9.5 mg/dL (ref 8.7–10.2)
Chloride: 104 mmol/L (ref 96–106)
Creatinine, Ser: 1.11 mg/dL (ref 0.76–1.27)
GFR calc Af Amer: 95 mL/min/{1.73_m2} (ref >59)
GFR calc non Af Amer: 82 mL/min/{1.73_m2} (ref >59)
Globulin, Total: 2.5 g/dL (ref 1.5–4.5)
Glucose: 85 mg/dL (ref 65–99)
Potassium: 4.1 mmol/L (ref 3.5–5.2)
Sodium: 140 mmol/L (ref 134–144)
Total Protein: 7 g/dL (ref 6.0–8.5)

## 2019-11-05 LAB — LIPID PANEL
Chol/HDL Ratio: 3.9 ratio (ref 0.0–5.0)
Cholesterol, Total: 125 mg/dL (ref 100–199)
HDL: 32 mg/dL — ABNORMAL LOW (ref >39)
LDL Chol Calc (NIH): 68 mg/dL (ref 0–99)
Triglycerides: 139 mg/dL (ref 0–149)
VLDL Cholesterol Cal: 25 mg/dL (ref 5–40)

## 2019-11-05 LAB — TSH: TSH: 2.5 u[IU]/mL (ref 0.450–4.500)

## 2019-11-05 NOTE — Telephone Encounter (Signed)
-----   Message from Margo Common, [place] sent at 11/05/2019  8:22 AM EDT ----- All blood tests are normal except HDL cholesterol still a little low. Continue Atorvastatin 40 mg qd and recheck progress in 6 months.

## 2019-11-05 NOTE — Telephone Encounter (Signed)
Called to advise patient of lab results, no answer. LVMTCB. If patient calls back it is okay for pec to advise.

## 2019-11-08 DIAGNOSIS — M9903 Segmental and somatic dysfunction of lumbar region: Secondary | ICD-10-CM | POA: Diagnosis not present

## 2019-11-08 DIAGNOSIS — R519 Headache, unspecified: Secondary | ICD-10-CM | POA: Diagnosis not present

## 2019-11-08 DIAGNOSIS — M542 Cervicalgia: Secondary | ICD-10-CM | POA: Diagnosis not present

## 2019-11-08 DIAGNOSIS — M9901 Segmental and somatic dysfunction of cervical region: Secondary | ICD-10-CM | POA: Diagnosis not present

## 2019-11-12 ENCOUNTER — Other Ambulatory Visit: Payer: Self-pay | Admitting: *Deleted

## 2019-11-12 DIAGNOSIS — N529 Male erectile dysfunction, unspecified: Secondary | ICD-10-CM

## 2019-11-12 MED ORDER — TADALAFIL 5 MG PO TABS: 5.0000 mg | ORAL_TABLET | Freq: Every day | ORAL | 3 refills | Status: DC | PRN

## 2019-11-12 NOTE — Telephone Encounter (Signed)
Patient was notified of results. Expressed understanding. Scheduled follow up.

## 2020-01-17 ENCOUNTER — Other Ambulatory Visit: Payer: Self-pay

## 2020-01-17 ENCOUNTER — Encounter: Payer: Self-pay | Admitting: Family Medicine

## 2020-01-17 ENCOUNTER — Ambulatory Visit (INDEPENDENT_AMBULATORY_CARE_PROVIDER_SITE_OTHER): Payer: BC Managed Care – PPO | Admitting: Family Medicine

## 2020-01-17 VITALS — BP 134/100 | HR 90 | Temp 98.9°F | Wt 237.0 lb

## 2020-01-17 DIAGNOSIS — E039 Hypothyroidism, unspecified: Secondary | ICD-10-CM

## 2020-01-17 DIAGNOSIS — Z0000 Encounter for general adult medical examination without abnormal findings: Secondary | ICD-10-CM

## 2020-01-17 DIAGNOSIS — E782 Mixed hyperlipidemia: Secondary | ICD-10-CM

## 2020-01-17 DIAGNOSIS — I10 Essential (primary) hypertension: Secondary | ICD-10-CM

## 2020-01-17 MED ORDER — AMLODIPINE BESYLATE 5 MG PO TABS: 5.0000 mg | ORAL_TABLET | Freq: Every day | ORAL | 3 refills | Status: DC

## 2020-01-17 NOTE — Progress Notes (Signed)
Complete physical exam   Patient: Matthew York   DOB: 17-Aug-1980   39 y.o. Male  MRN: [id number] Visit Date: 01/17/2020  Today's healthcare provider: Vernie Murders, PA   No chief complaint on file.  Subjective    Matthew York is a 39 y.o. male who presents today for a complete physical exam.  He reports consuming a general diet.  He generally feels well. He reports sleeping well. He does not have additional problems to discuss today.  HPI    Past Medical History:  Diagnosis Date  . Elevated blood pressure    Past Surgical History:  Procedure Laterality Date  . VASECTOMY  2014   Social History   Socioeconomic History  . Marital status: Married    Spouse name: Not on file  . Number of children: Not on file  . Years of education: Not on file  . Highest education level: Not on file  Occupational History  . Not on file  Tobacco Use  . Smoking status: Never Smoker  . Smokeless tobacco: Never Used  Substance and Sexual Activity  . Alcohol use: Yes    Comment: Occasional use  . Drug use: No  . Sexual activity: Not on file  Other Topics Concern  . Not on file  Social History Narrative  . Not on file   Social Determinants of Health   Financial Resource Strain:   . Difficulty of Paying Living Expenses: Not on file  Food Insecurity:   . Worried About Charity fundraiser in the Last Year: Not on file  . Ran Out of Food in the Last Year: Not on file  Transportation Needs:   . Lack of Transportation (Medical): Not on file  . Lack of Transportation (Non-Medical): Not on file  Physical Activity:   . Days of Exercise per Week: Not on file  . Minutes of Exercise per Session: Not on file  Stress:   . Feeling of Stress : Not on file  Social Connections:   . Frequency of Communication with Friends and Family: Not on file  . Frequency of Social Gatherings with Friends and Family: Not on file  . Attends Religious Services: Not on file  . Active Member of Clubs or  Organizations: Not on file  . Attends Archivist Meetings: Not on file  . Marital Status: Not on file  Intimate Partner Violence:   . Fear of Current or Ex-Partner: Not on file  . Emotionally Abused: Not on file  . Physically Abused: Not on file  . Sexually Abused: Not on file   Family Status  Relation Name Status  . Mother  Alive  . Father  Alive   Family History  Problem Relation Age of Onset  . Hypertension Father    No Known Allergies  Patient Care Team: Chrismon, Vickki Muff, PA as PCP - General (Family Medicine)   Medications: Outpatient Medications Prior to Visit  Medication Sig  . atorvastatin (LIPITOR) 40 MG tablet Take 1 tablet (40 mg total) by mouth daily.  . Inositol Niacinate (NIACIN FLUSH FREE) 500 MG CAPS Take 1 capsule by mouth daily.  Marland Kitchen levothyroxine (SYNTHROID) 88 MCG tablet Take 1 tablet (88 mcg total) by mouth daily before breakfast.  . metoprolol succinate (TOPROL-XL) 50 MG 24 hr tablet TAKE 1 TABLET (50 MG TOTAL) BY MOUTH DAILY.  . tadalafil (CIALIS) 5 MG tablet Take 1 tablet (5 mg total) by mouth daily as needed for erectile dysfunction.   No  facility-administered medications prior to visit.    Review of Systems  Constitutional: Negative.   HENT: Negative.   Eyes: Negative.   Respiratory: Negative.   Cardiovascular: Negative.   Gastrointestinal: Negative.   Endocrine: Negative.   Genitourinary: Negative.   Musculoskeletal: Negative.   Skin: Negative.   Allergic/Immunologic: Negative.   Neurological: Negative.   Hematological: Negative.   Psychiatric/Behavioral: Negative.    Recent Results (from the past 2160 hour(s))  CBC with Differential/Platelet     Status: None   Collection Time: 11/04/19  9:08 AM  Result Value Ref Range   WBC 5.3 3.4 - 10.8 x10E3/uL   RBC 5.73 4.14 - 5.80 x10E6/uL   Hemoglobin 16.4 13.0 - 17.7 g/dL   Hematocrit 48.0 37.5 - 51.0 %   MCV 84 79 - 97 fL   MCH 28.6 26.6 - 33.0 pg   MCHC 34.2 31 - 35 g/dL   RDW  12.5 11.6 - 15.4 %   Platelets 247 150 - 450 x10E3/uL   Neutrophils 50 Not Estab. %   Lymphs 40 Not Estab. %   Monocytes 7 Not Estab. %   Eos 2 Not Estab. %   Basos 1 Not Estab. %   Neutrophils Absolute 2.7 1.40 - 7.00 x10E3/uL   Lymphocytes Absolute 2.1 0 - 3 x10E3/uL   Monocytes Absolute 0.4 0 - 0 x10E3/uL   EOS (ABSOLUTE) 0.1 0.0 - 0.4 x10E3/uL   Basophils Absolute 0.0 0 - 0 x10E3/uL   Immature Granulocytes 0 Not Estab. %   Immature Grans (Abs) 0.0 0.0 - 0.1 x10E3/uL  Comprehensive metabolic panel     Status: Abnormal   Collection Time: 11/04/19  9:08 AM  Result Value Ref Range   Glucose 85 65 - 99 mg/dL   BUN 14 6 - 24 mg/dL   Creatinine, Ser 1.11 0.76 - 1.27 mg/dL   GFR calc non Af Amer 82 >59 mL/min/1.73   GFR calc Af Amer 95 >59 mL/min/1.73    Comment: **[facility] currently reports eGFR in compliance with the current**   recommendations of the [employer]. [facility] will   update reporting as new guidelines are published from the NKF-ASN   Task force.    BUN/Creatinine Ratio 13 9 - 20   Sodium 140 134 - 144 mmol/L   Potassium 4.1 3.5 - 5.2 mmol/L   Chloride 104 96 - 106 mmol/L   CO2 22 20 - 29 mmol/L   Calcium 9.5 8.7 - 10.2 mg/dL   Total Protein 7.0 6.0 - 8.5 g/dL   Albumin 4.5 4.0 - 5.0 g/dL   Globulin, Total 2.5 1.5 - 4.5 g/dL   Albumin/Globulin Ratio 1.8 1.2 - 2.2   Bilirubin Total 1.7 (H) 0.0 - 1.2 mg/dL   Alkaline Phosphatase 68 44 - 121 IU/L    Comment:               **Please note reference interval change**   AST 18 0 - 40 IU/L   ALT 32 0 - 44 IU/L  TSH     Status: None   Collection Time: 11/04/19  9:08 AM  Result Value Ref Range   TSH 2.500 0.450 - 4.500 uIU/mL  Lipid panel     Status: Abnormal   Collection Time: 11/04/19  9:08 AM  Result Value Ref Range   Cholesterol, Total 125 100 - 199 mg/dL   Triglycerides 139 0 - 149 mg/dL   HDL 32 (L) >39 mg/dL   VLDL Cholesterol Cal 25 5 -  40 mg/dL   LDL Chol Calc (NIH) 68 0 - 99 mg/dL   Chol/HDL  Ratio 3.9 0.0 - 5.0 ratio    Comment:                                   T. Chol/HDL Ratio                                             Men  Women                               1/2 Avg.Risk  3.4    3.3                                   Avg.Risk  5.0    4.4                                2X Avg.Risk  9.6    7.1                                3X Avg.Risk 23.4   11.0         Objective    BP (!) 134/100 (BP Location: Right Arm, Patient Position: Sitting, Cuff Size: Normal)   Pulse 90   Temp 98.9 F (37.2 C) (Oral)   Wt 237 lb (107.5 kg)   SpO2 99%   BMI 31.27 kg/m   Wt Readings from Last 3 Encounters:  01/17/20 237 lb (107.5 kg)  11/04/19 234 lb (106.1 kg)  07/29/19 239 lb 6.4 oz (108.6 kg)      Vitals:   01/17/20 1323 01/17/20 1328  BP: (!) 154/101 (!) 134/100  Pulse: 90   Temp: 98.9 F (37.2 C)   TempSrc: Oral   SpO2: 99%   Weight: 237 lb (107.5 kg)      Physical Exam Constitutional:      Appearance: Normal appearance. He is normal weight.  HENT:     Head: Normocephalic and atraumatic.     Right Ear: Tympanic membrane, ear canal and external ear normal.     Left Ear: Tympanic membrane, ear canal and external ear normal.     Nose: Nose normal.     Mouth/Throat:     Mouth: Mucous membranes are moist.     Pharynx: Oropharynx is clear.  Eyes:     Extraocular Movements: Extraocular movements intact.     Conjunctiva/sclera: Conjunctivae normal.     Pupils: Pupils are equal, round, and reactive to light.  Cardiovascular:     Rate and Rhythm: Normal rate and regular rhythm.     Pulses: Normal pulses.     Heart sounds: Normal heart sounds.  Pulmonary:     Effort: Pulmonary effort is normal.     Breath sounds: Normal breath sounds.  Abdominal:     General: Abdomen is flat. Bowel sounds are normal.     Palpations: Abdomen is soft.  Genitourinary:    Penis: Normal.      Testes: Normal.     Prostate: Normal.  Rectum: Normal. Guaiac result negative.    Musculoskeletal:        General: Normal range of motion.     Cervical back: Normal range of motion and neck supple.  Skin:    General: Skin is warm and dry.  Neurological:     General: No focal deficit present.     Mental Status: He is alert and oriented to person, place, and time. Mental status is at baseline.  Psychiatric:        Mood and Affect: Mood normal.        Behavior: Behavior normal.        Thought Content: Thought content normal.        Judgment: Judgment normal.      Last depression screening scores PHQ 2/9 Scores 01/17/2020 11/04/2019 12/15/2018  PHQ - 2 Score 0 0 0   Last fall risk screening Fall Risk  01/17/2020  Falls in the past year? 0  Number falls in past yr: 0  Injury with Fall? 0  Follow up -   Last Audit-C alcohol use screening Alcohol Use Disorder Test (AUDIT) 01/17/2020  1. How often do you have a drink containing alcohol? 1  2. How many drinks containing alcohol do you have on a typical day when you are drinking? 0  3. How often do you have six or more drinks on one occasion? 0  AUDIT-C Score 1  Alcohol Brief Interventions/Follow-up AUDIT Score <7 follow-up not indicated   A score of 3 or more in women, and 4 or more in men indicates increased risk for alcohol abuse, EXCEPT if all of the points are from question 1   No results found for any visits on 01/17/20.  Assessment & Plan    Routine Health Maintenance and Physical Exam  Exercise Activities and Dietary recommendations Goals   Continue to exercise 30-40 minutes 3-4 days a week. Restrict salt/sodium and fats in diet.     Immunization History  Administered Date(s) Administered  . Influenza,inj,Quad PF,6+ Mos 11/03/2012, 12/23/2014, 12/15/2018  . PFIZER SARS-COV-2 Vaccination 08/10/2019, 08/31/2019  . Tdap 04/22/2019    Health Maintenance  Topic Date Due  . Hepatitis C Screening  Never done  . HIV Screening  Never done  . INFLUENZA VACCINE  05/18/2020 (Originally 09/19/2019)  .  TETANUS/TDAP  04/21/2029  . COVID-19 Vaccine  Completed    Discussed health benefits of physical activity, and encouraged him to engage in regular exercise appropriate for his age and condition.  1. Annual physical exam General health stable. Immunizations up to date. Labs done on 11-04-19 were essentially normal. Given anticipatory counseling.  2. Hypothyroidism, unspecified type Tolerating Levothyroxine 88 mcg qd without exophthalmos, palpitations, heat or cold intolerance and no dry skin or brittle nails. TSH was 2.500 on 11-04-19.  3. Essential hypertension Despite Toprol-XL 50 mg qd but BP still high today. Will restrict salt/sodium, ETOH and does not smoke. Continue present meds and add Amlodipine 5 mg qd. Recheck in a month.  4. Mixed hyperlipidemia Improvement in lipid panel on 11-04-19. Tolerating Lipitor 40 mg qd. Continue low fat diet and regular exercise.   No follow-ups on file.        Vernie Murders, [place] [phone] (phone) [phone] (fax)  [place]

## 2020-01-29 ENCOUNTER — Other Ambulatory Visit: Payer: Self-pay | Admitting: Family Medicine

## 2020-01-29 DIAGNOSIS — I10 Essential (primary) hypertension: Secondary | ICD-10-CM

## 2020-02-16 ENCOUNTER — Ambulatory Visit: Payer: Self-pay | Admitting: *Deleted

## 2020-02-16 NOTE — Telephone Encounter (Signed)
I returned pt's call.   I let him know to continue taking the amlodipine 5 mg and the metoprolol 50 mg 24 hr.   To take both medications until re-evaluated on his next visit or he has problems then call us back. He verbalized understanding and thanked me for returning his call.  Reason for Disposition . Caller has medicine question only, adult not sick, AND triager answers question  Answer Assessment - Initial Assessment Questions 1. NAME of MEDICATION: "What medicine are you calling about?"     The amlodipine 5 mg and metoprolol 50 mg 24 hr. 2. QUESTION: "What is your question?" (e.g., medication refill, side effect)     Do I continue the metoprolol and take the amlodipine? 3. PRESCRIBING HCP: "Who prescribed it?" Reason: if prescribed by specialist, call should be referred to that group.     Dennis Chrismon 4. SYMPTOMS: "Do you have any symptoms?"     No 5. SEVERITY: If symptoms are present, ask "Are they mild, moderate or severe?"     N/A 6. PREGNANCY:  "Is there any chance that you are pregnant?" "When was your last menstrual period?"     N/A  Protocols used: MEDICATION QUESTION CALL-A-AH

## 2020-03-10 ENCOUNTER — Ambulatory Visit: Payer: BC Managed Care – PPO | Admitting: Family Medicine

## 2020-03-14 ENCOUNTER — Encounter: Payer: Self-pay | Admitting: Family Medicine

## 2020-03-14 ENCOUNTER — Other Ambulatory Visit: Payer: Self-pay

## 2020-03-14 ENCOUNTER — Ambulatory Visit: Payer: BC Managed Care – PPO | Admitting: Family Medicine

## 2020-03-14 VITALS — BP 134/101 | HR 77 | Temp 98.4°F | Ht 73.0 in | Wt 235.0 lb

## 2020-03-14 DIAGNOSIS — E782 Mixed hyperlipidemia: Secondary | ICD-10-CM | POA: Diagnosis not present

## 2020-03-14 DIAGNOSIS — E039 Hypothyroidism, unspecified: Secondary | ICD-10-CM | POA: Diagnosis not present

## 2020-03-14 DIAGNOSIS — I10 Essential (primary) hypertension: Secondary | ICD-10-CM | POA: Diagnosis not present

## 2020-03-14 NOTE — Progress Notes (Signed)
Established Patient Office Visit  Subjective:  Patient ID: Matthew York, male    DOB: 04/25/1980  Age: 40 y.o. MRN: [id number]  CC: No chief complaint on file.   HPI Matthew York presents for BP recheck. Hypertension, follow-up  BP Readings from Last 3 Encounters:  01/17/20 (!) 134/100  11/04/19 (!) 137/93  07/29/19 (!) 147/99   Wt Readings from Last 3 Encounters:  01/17/20 237 lb (107.5 kg)  11/04/19 234 lb (106.1 kg)  07/29/19 239 lb 6.4 oz (108.6 kg)     He was last seen for hypertension 2 months ago.  BP at that visit was 134/100. Management since that visit includes "Despite Toprol-XL 50 mg qd but BP still high today. Will restrict salt/sodium, ETOH and does not smoke. Continue present meds and add Amlodipine 5 mg qd. Recheck in a month."   He reports excellent compliance with treatment. He is not having side effects.  He is following a Regular diet. He is exercising. Walking 10 miles a week. He does not smoke.  Use of agents associated with hypertension: none.  Outside blood pressures are N/A. Symptoms: No chest pain Yes chest pressure  No palpitations No syncope  No dyspnea No orthopnea  No paroxysmal nocturnal dyspnea No lower extremity edema   Pertinent labs: Lab Results  Component Value Date   CHOL 125 11/04/2019   HDL 32 (L) 11/04/2019   LDLCALC 68 11/04/2019   TRIG 139 11/04/2019   CHOLHDL 3.9 11/04/2019   Lab Results  Component Value Date   NA 140 11/04/2019   K 4.1 11/04/2019   CREATININE 1.11 11/04/2019   GFRNONAA 82 11/04/2019   GFRAA 95 11/04/2019   GLUCOSE 85 11/04/2019     The ASCVD Risk score (Goff DC Jr., et al., 2013) failed to calculate for the following reasons:   The valid total cholesterol range is 130 to 320 mg/dL   ---------------------------------------------------------------------------------------------------   Past Medical History:  Diagnosis Date   Elevated blood pressure     Past Surgical History:  Procedure  Laterality Date   VASECTOMY  2014    Family History  Problem Relation Age of Onset   Hypertension Father     Social History   Socioeconomic History   Marital status: Married    Spouse name: Not on file   Number of children: Not on file   Years of education: Not on file   Highest education level: Not on file  Occupational History   Not on file  Tobacco Use   Smoking status: Never Smoker   Smokeless tobacco: Never Used  Substance and Sexual Activity   Alcohol use: Yes    Comment: Occasional use   Drug use: No   Sexual activity: Not on file  Other Topics Concern   Not on file  Social History Narrative   Not on file   Social Determinants of Health   Financial Resource Strain: Not on file  Food Insecurity: Not on file  Transportation Needs: Not on file  Physical Activity: Not on file  Stress: Not on file  Social Connections: Not on file  Intimate Partner Violence: Not on file    Outpatient Medications Prior to Visit  Medication Sig Dispense Refill   amLODipine (NORVASC) 5 MG tablet Take 1 tablet (5 mg total) by mouth daily. 90 tablet 3   atorvastatin (LIPITOR) 40 MG tablet Take 1 tablet (40 mg total) by mouth daily. 90 tablet 3   Inositol Niacinate (NIACIN FLUSH FREE) 500 MG CAPS  Take 1 capsule by mouth daily.     levothyroxine (SYNTHROID) 88 MCG tablet Take 1 tablet (88 mcg total) by mouth daily before breakfast. 90 tablet 3   metoprolol succinate (TOPROL-XL) 50 MG 24 hr tablet TAKE 1 TABLET BY MOUTH EVERY DAY 90 tablet 0   tadalafil (CIALIS) 5 MG tablet Take 1 tablet (5 mg total) by mouth daily as needed for erectile dysfunction. 30 tablet 3   No facility-administered medications prior to visit.    No Known Allergies  Review of Systems  Constitutional: Negative.   HENT: Negative.   Eyes: Negative.   Respiratory: Negative.   Cardiovascular: Negative.   Gastrointestinal: Negative.   Genitourinary: Negative.   Musculoskeletal: Negative.   Skin: Negative.    Neurological: Negative.   Psychiatric/Behavioral: Negative.      Objective:    Physical Exam Constitutional:      General: He is not in acute distress.    Appearance: He is well-developed and well-nourished.  HENT:     Head: Normocephalic and atraumatic.     Right Ear: Hearing normal.     Left Ear: Hearing normal.     Nose: Nose normal.  Eyes:     General: Lids are normal. No scleral icterus.       Right eye: No discharge.        Left eye: No discharge.     Conjunctiva/sclera: Conjunctivae normal.  Cardiovascular:     Rate and Rhythm: Normal rate and regular rhythm.     Heart sounds: Normal heart sounds.  Pulmonary:     Effort: Pulmonary effort is normal. No respiratory distress.     Breath sounds: Normal breath sounds.  Abdominal:     General: Bowel sounds are normal.     Palpations: Abdomen is soft.  Musculoskeletal:        General: Normal range of motion.  Skin:    General: Skin is intact.     Findings: No lesion or rash.  Neurological:     Mental Status: He is alert and oriented to person, place, and time.  Psychiatric:        Mood and Affect: Mood and affect normal.        Speech: Speech normal.        Behavior: Behavior normal.        Thought Content: Thought content normal.    BP (!) 134/101 (BP Location: Right Arm, Patient Position: Sitting, Cuff Size: Large)    Pulse 77    Temp 98.4 F (36.9 C) (Oral)    Ht 6\' 1"  (1.854 m)    Wt 235 lb (106.6 kg)    SpO2 98%    BMI 31.00 kg/m  Wt Readings from Last 3 Encounters:  01/17/20 237 lb (107.5 kg)  11/04/19 234 lb (106.1 kg)  07/29/19 239 lb 6.4 oz (108.6 kg)   Health Maintenance Due  Topic Date Due   Hepatitis C Screening  Never done   HIV Screening  Never done   COVID-19 Vaccine (3 - Booster for [place] series) 03/02/2020    There are no preventive care reminders to display for this patient.  Lab Results  Component Value Date   TSH 2.500 11/04/2019   Lab Results  Component Value Date   WBC 5.3  11/04/2019   HGB 16.4 11/04/2019   HCT 48.0 11/04/2019   MCV 84 11/04/2019   PLT 247 11/04/2019   Lab Results  Component Value Date   NA 140 11/04/2019   K  4.1 11/04/2019   CO2 22 11/04/2019   GLUCOSE 85 11/04/2019   BUN 14 11/04/2019   CREATININE 1.11 11/04/2019   BILITOT 1.7 (H) 11/04/2019   ALKPHOS 68 11/04/2019   AST 18 11/04/2019   ALT 32 11/04/2019   PROT 7.0 11/04/2019   ALBUMIN 4.5 11/04/2019   CALCIUM 9.5 11/04/2019   Lab Results  Component Value Date   CHOL 125 11/04/2019   Lab Results  Component Value Date   HDL 32 (L) 11/04/2019   Lab Results  Component Value Date   LDLCALC 68 11/04/2019   Lab Results  Component Value Date   TRIG 139 11/04/2019   Lab Results  Component Value Date   CHOLHDL 3.9 11/04/2019   No results found for: HGBA1C    Assessment & Plan:  1. Primary hypertension BP much higher today despite use of Metoprolol succinate 50 mg qd and Amlodipine 5 mg qd. Will recheck labs and increase Amlodipine to 10 mg qd. Continue sodium restrictions and reduce weight. - CBC with Differential/Platelet - Comprehensive metabolic panel - Lipid panel - TSH  2. Hypothyroidism, unspecified type Tolerating Levothyroxine 88 mcg qd without palpitations or tremors. Will recheck follow up labs. - CBC with Differential/Platelet - TSH - T4  3. Mixed hyperlipidemia Still taking Atorvastatin 40 mg qd without side effects. Work on weight loss by following low fat diet and exercising 30-40 minutes 3-4 days a week. Increase water intake and recheck labs. - CBC with Differential/Platelet - Comprehensive metabolic panel - Lipid panel - TSH  Sylvester Harder, CMA  I, Kathya Wilz, PA-C, have reviewed all documentation for this visit. The documentation on 11/09/20 for the exam, diagnosis, procedures, and orders are all accurate and complete.

## 2020-04-18 ENCOUNTER — Telehealth: Payer: Self-pay | Admitting: Family Medicine

## 2020-04-18 DIAGNOSIS — I10 Essential (primary) hypertension: Secondary | ICD-10-CM

## 2020-04-18 MED ORDER — AMLODIPINE BESYLATE 10 MG PO TABS: 10.0000 mg | ORAL_TABLET | Freq: Every day | ORAL | 3 refills | Status: DC

## 2020-04-18 NOTE — Telephone Encounter (Signed)
Pt called about his refills for amLODipine (NORVASC) 5 MG tablet  / he should have refills at the pharmacy but he stated that he was advised to increase to taking 2 tabs daily instead of one. So he has finished the RX sooner than suppose to/ Pt wants to know if a new RX needs to be sent in ?/please advise

## 2020-04-18 NOTE — Telephone Encounter (Signed)
Amlodipine 10mg  sent in to [facility].

## 2020-04-22 ENCOUNTER — Other Ambulatory Visit: Payer: Self-pay | Admitting: Family Medicine

## 2020-04-22 DIAGNOSIS — I10 Essential (primary) hypertension: Secondary | ICD-10-CM

## 2020-04-22 NOTE — Telephone Encounter (Signed)
Requested Prescriptions  Pending Prescriptions Disp Refills  . metoprolol succinate (TOPROL-XL) 50 MG 24 hr tablet [Pharmacy Med Name: METOPROLOL SUCC ER 50 MG TAB] 90 tablet 0    Sig: TAKE 1 TABLET BY MOUTH EVERY DAY     Cardiovascular:  Beta Blockers Failed - 04/22/2020 12:48 AM      Failed - Last BP in normal range    BP Readings from Last 1 Encounters:  03/14/20 (!) 134/101         Passed - Last Heart Rate in normal range    Pulse Readings from Last 1 Encounters:  03/14/20 77         Passed - Valid encounter within last 6 months    Recent Outpatient Visits          1 month ago Primary hypertension   [facility], Vickki Muff, PA-C   3 months ago Annual physical exam   [place], PA-C   5 months ago Mixed hyperlipidemia   [facility], Vickki Muff, PA-C   8 months ago Essential hypertension   [facility], Vickki Muff, PA-C   9 months ago Pharyngitis, unspecified etiology   [facility], Vickki Muff, PA-C      Future Appointments            In 1 month [place], Vickki Muff, PA-C [employer], [place]

## 2020-05-06 ENCOUNTER — Other Ambulatory Visit: Payer: Self-pay | Admitting: Family Medicine

## 2020-05-06 DIAGNOSIS — E039 Hypothyroidism, unspecified: Secondary | ICD-10-CM

## 2020-05-11 ENCOUNTER — Ambulatory Visit: Payer: Self-pay | Admitting: Family Medicine

## 2020-05-23 ENCOUNTER — Ambulatory Visit: Payer: Self-pay | Admitting: Family Medicine

## 2020-05-26 ENCOUNTER — Ambulatory Visit: Payer: Self-pay | Admitting: Family Medicine

## 2020-05-30 ENCOUNTER — Ambulatory Visit: Payer: Self-pay | Admitting: Family Medicine

## 2020-05-31 ENCOUNTER — Encounter: Payer: Self-pay | Admitting: Family Medicine

## 2020-05-31 ENCOUNTER — Other Ambulatory Visit: Payer: Self-pay

## 2020-05-31 ENCOUNTER — Ambulatory Visit: Payer: BC Managed Care – PPO | Admitting: Family Medicine

## 2020-05-31 VITALS — BP 126/84 | HR 74 | Temp 98.6°F | Resp 16 | Ht 73.0 in | Wt 210.0 lb

## 2020-05-31 DIAGNOSIS — E782 Mixed hyperlipidemia: Secondary | ICD-10-CM

## 2020-05-31 DIAGNOSIS — E039 Hypothyroidism, unspecified: Secondary | ICD-10-CM | POA: Diagnosis not present

## 2020-05-31 DIAGNOSIS — I10 Essential (primary) hypertension: Secondary | ICD-10-CM | POA: Diagnosis not present

## 2020-05-31 NOTE — Progress Notes (Signed)
Established patient visit   Patient: Matthew York   DOB: January 07, 1981   40 y.o. Male  MRN: [id number] Visit Date: 05/31/2020  Today's healthcare provider: Laurita Quint Prima Rayner, FNP   Chief Complaint  Patient presents with  . Hypertension  . Hyperlipidemia   Subjective    HPI  Hypertension, follow-up  BP Readings from Last 3 Encounters:  05/31/20 126/84  03/14/20 (!) 134/101  01/17/20 (!) 134/100   Wt Readings from Last 3 Encounters:  05/31/20 210 lb (95.3 kg)  03/14/20 235 lb (106.6 kg)  01/17/20 237 lb (107.5 kg)     He was last seen for hypertension 3 months ago.  BP at that visit was 134/101. Management since that visit includes adding amlodpine 5mg  daily.  He reports good compliance with treatment. He is not having side effects.  He is following a Low fat, Low Sodium diet. He is exercising. He walks on average 109miles a week.  He does not smoke.  Use of agents associated with hypertension: none.   Outside blood pressures are checked daily. Symptoms: No chest pain No chest pressure  No palpitations No syncope  No dyspnea No orthopnea  No paroxysmal nocturnal dyspnea No lower extremity edema   Pertinent labs: Lab Results  Component Value Date   CHOL 125 11/04/2019   HDL 32 (L) 11/04/2019   LDLCALC 68 11/04/2019   TRIG 139 11/04/2019   CHOLHDL 3.9 11/04/2019   Lab Results  Component Value Date   NA 140 11/04/2019   K 4.1 11/04/2019   CREATININE 1.11 11/04/2019   GFRNONAA 82 11/04/2019   GFRAA 95 11/04/2019   GLUCOSE 85 11/04/2019     The ASCVD Risk score (Goff DC Jr., et al., 2013) failed to calculate for the following reasons:   The valid total cholesterol range is 130 to 320 mg/dL   Lipid/Cholesterol, Follow-up  Last lipid panel Other pertinent labs  Lab Results  Component Value Date   CHOL 125 11/04/2019   HDL 32 (L) 11/04/2019   LDLCALC 68 11/04/2019   TRIG 139 11/04/2019   CHOLHDL 3.9 11/04/2019   Lab Results  Component Value Date   ALT  32 11/04/2019   AST 18 11/04/2019   PLT 247 11/04/2019   TSH 2.500 11/04/2019     He was last seen for this 3 months ago.  Management since that visit includes no medication changes.  He reports good compliance with treatment. He is not having side effects.   Symptoms: No chest pain No chest pressure/discomfort  No dyspnea No lower extremity edema  No numbness or tingling of extremity No orthopnea  No palpitations No paroxysmal nocturnal dyspnea  No speech difficulty No syncope   Current diet: low fat/ cholesterol Current exercise: cardiovascular workout on exercise equipment  The ASCVD Risk score ([place] et al., 2013) failed to calculate for the following reasons:   The valid total cholesterol range is 130 to 320 mg/dL  Currently working on LFM Started the [employer] 1 meal a day that's lean and green Lean chicken, pork And then gets snacks thru the day (5) "fuelings" Losing about 5 lbs a week BP has improved on this diet  Is wondering about a goal weight for him and thoughts on the diet Has not increased exercise on this diet  Wt Readings from Last 3 Encounters:  05/31/20 210 lb (95.3 kg)  03/14/20 235 lb (106.6 kg)  01/17/20 237 lb (107.5 kg)  Medications: Outpatient Medications Prior to Visit  Medication Sig  . amLODipine (NORVASC) 10 MG tablet Take 1 tablet (10 mg total) by mouth daily.  Marland Kitchen atorvastatin (LIPITOR) 40 MG tablet Take 1 tablet (40 mg total) by mouth daily.  . Inositol Niacinate (NIACIN FLUSH FREE) 500 MG CAPS Take 1 capsule by mouth daily.  Marland Kitchen levothyroxine (SYNTHROID) 88 MCG tablet TAKE 1 TABLET (88 MCG TOTAL) BY MOUTH DAILY BEFORE BREAKFAST.  . metoprolol succinate (TOPROL-XL) 50 MG 24 hr tablet TAKE 1 TABLET BY MOUTH EVERY DAY  . tadalafil (CIALIS) 5 MG tablet Take 1 tablet (5 mg total) by mouth daily as needed for erectile dysfunction.   No facility-administered medications prior to visit.    Review of Systems  Constitutional:  Negative for fatigue.  Respiratory: Negative for cough and shortness of breath.   Cardiovascular: Negative for chest pain, palpitations and leg swelling.  Musculoskeletal: Negative for arthralgias and myalgias.  Neurological: Negative for dizziness, light-headedness and headaches.       Objective    BP 126/84   Pulse 74   Temp 98.6 F (37 C)   Resp 16   Ht 6\' 1"  (1.854 m)   Wt 210 lb (95.3 kg)   BMI 27.71 kg/m   Physical Exam Vitals reviewed.  Constitutional:      Appearance: Normal appearance.  HENT:     Head: Normocephalic and atraumatic.  Eyes:     Conjunctiva/sclera: Conjunctivae normal.     Pupils: Pupils are equal, round, and reactive to light.  Cardiovascular:     Rate and Rhythm: Normal rate and regular rhythm.     Pulses: Normal pulses.     Heart sounds: Normal heart sounds. No murmur heard. No friction rub. No gallop.   Pulmonary:     Effort: Pulmonary effort is normal. No respiratory distress.     Breath sounds: Normal breath sounds. No stridor. No wheezing or rales.  Abdominal:     General: Bowel sounds are normal.     Palpations: Abdomen is soft.     Tenderness: There is no abdominal tenderness.  Musculoskeletal:     Right lower leg: No edema.     Left lower leg: No edema.  Skin:    General: Skin is warm and dry.  Neurological:     General: No focal deficit present.     Mental Status: He is alert and oriented to person, place, and time.  Psychiatric:        Mood and Affect: Mood normal.        Behavior: Behavior normal.      No results found for any visits on 05/31/20.  Assessment & Plan     Problem List Items Addressed This Visit      Cardiovascular and Mediastinum   Hypertension - Primary   Relevant Orders   Comprehensive metabolic panel     Endocrine   Hypothyroidism   Relevant Orders   TSH     Other   Mixed hyperlipidemia   Relevant Orders   Lipid Panel     Plan Will follow up with lab results Discussed diet plan, goal  BMI and weight Discussed when/how to transition off the diet R/se/b of diet discussed and RTC precautions  Return in about 6 months (around 11/30/2020).      [place], [place] [phone] (phone) [phone] (fax)  [place]

## 2020-05-31 NOTE — Patient Instructions (Signed)

## 2020-06-19 ENCOUNTER — Other Ambulatory Visit: Payer: Self-pay | Admitting: Family Medicine

## 2020-06-19 DIAGNOSIS — N529 Male erectile dysfunction, unspecified: Secondary | ICD-10-CM

## 2020-06-19 MED ORDER — TADALAFIL 5 MG PO TABS: 5.0000 mg | ORAL_TABLET | Freq: Every day | ORAL | 3 refills | Status: DC | PRN

## 2020-06-19 NOTE — Telephone Encounter (Signed)
Medication Refill - Medication:   tadalafil (CIALIS) 5 MG tablet   Has the patient contacted their pharmacy? Yes.  contact pcp office.    Preferred Pharmacy (with phone number or street name):   [id number] - [place], [place]  [address] [place] [address]  Phone: [phone] Fax: [phone]     Agent: Please be advised that RX refills may take up to 3 business days. We ask that you follow-up with your pharmacy.

## 2020-07-21 ENCOUNTER — Other Ambulatory Visit: Payer: Self-pay | Admitting: Family Medicine

## 2020-07-21 DIAGNOSIS — E782 Mixed hyperlipidemia: Secondary | ICD-10-CM

## 2020-07-21 DIAGNOSIS — I10 Essential (primary) hypertension: Secondary | ICD-10-CM

## 2020-09-13 ENCOUNTER — Other Ambulatory Visit: Payer: Self-pay | Admitting: Family Medicine

## 2020-09-13 DIAGNOSIS — E039 Hypothyroidism, unspecified: Secondary | ICD-10-CM

## 2020-09-13 MED ORDER — LEVOTHYROXINE SODIUM 88 MCG PO TABS: 88.0000 ug | ORAL_TABLET | Freq: Every day | ORAL | 0 refills | Status: DC

## 2020-09-13 NOTE — Telephone Encounter (Signed)
Requested Prescriptions  Pending Prescriptions Disp Refills  . levothyroxine (SYNTHROID) 88 MCG tablet 90 tablet 0    Sig: Take 1 tablet (88 mcg total) by mouth daily before breakfast.     Endocrinology:  Hypothyroid Agents Failed - 09/13/2020  4:30 PM      Failed - TSH needs to be rechecked within 3 months after an abnormal result. Refill until TSH is due.      Passed - TSH in normal range and within 360 days    TSH  Date Value Ref Range Status  11/04/2019 2.500 0.450 - 4.500 uIU/mL Final         Passed - Valid encounter within last 12 months    Recent Outpatient Visits          3 months ago Primary hypertension   [facility], Laurita Quint, FNP   6 months ago Primary hypertension   [facility], Vickki Muff, PA-C   8 months ago Annual physical exam   [place], PA-C   10 months ago Mixed hyperlipidemia   [facility], Vickki Muff, PA-C   1 year ago Essential hypertension   [facility], Vickki Muff, [place]

## 2020-09-13 NOTE — Telephone Encounter (Signed)
Medication Refill - Medication: levothyroxine (SYNTHROID) 88 MCG tablet Pt had a 6 month follow up with [facility] and didn't receive refill/ please advise   Has the patient contacted their pharmacy? Yes.   (Agent: If no, request that the patient contact the pharmacy for the refill.) (Agent: If yes, when and what did the pharmacy advise?)  Preferred Pharmacy (with phone number or street name): [id number] [place] [address] [place] Phone:  [phone] Fax:  [phone]  Agent: Please be advised that RX refills may take up to 3 business days. We ask that you follow-up with your pharmacy.

## 2020-09-28 ENCOUNTER — Ambulatory Visit (INDEPENDENT_AMBULATORY_CARE_PROVIDER_SITE_OTHER): Payer: BC Managed Care – PPO | Admitting: Ophthalmology

## 2020-09-28 ENCOUNTER — Encounter (INDEPENDENT_AMBULATORY_CARE_PROVIDER_SITE_OTHER): Payer: Self-pay | Admitting: Ophthalmology

## 2020-09-28 ENCOUNTER — Other Ambulatory Visit: Payer: Self-pay

## 2020-09-28 DIAGNOSIS — H3581 Retinal edema: Secondary | ICD-10-CM

## 2020-09-28 DIAGNOSIS — H43812 Vitreous degeneration, left eye: Secondary | ICD-10-CM

## 2020-09-28 DIAGNOSIS — H35033 Hypertensive retinopathy, bilateral: Secondary | ICD-10-CM

## 2020-09-28 DIAGNOSIS — H5213 Myopia, bilateral: Secondary | ICD-10-CM

## 2020-09-28 DIAGNOSIS — I10 Essential (primary) hypertension: Secondary | ICD-10-CM | POA: Diagnosis not present

## 2020-09-28 NOTE — Progress Notes (Signed)
[facility] Note  09/28/2020     CHIEF COMPLAINT Patient presents for Retina Evaluation   HISTORY OF PRESENT ILLNESS: Matthew York is a 40 y.o. male who presents to the clinic today for:   HPI     Retina Evaluation   In left eye.  This started 1 day ago.  Duration of 1 day.  Associated Symptoms Floaters.  Context:  distance vision and near vision.  I, the attending physician,  performed the HPI with the patient and updated documentation appropriately.        Comments   Pt has not noticed decrease in his vision in either eye.  Pt woke up this morning with dark floater OS and had a couple episodes of flashes of light in OS periphery yesterday.  Pt is high myope OU.  Wears contact lenses mostly.  Pt denies eye pain or discomfort.      Last edited by Bernarda Caffey, MD on 09/28/2020  1:06 PM.      Referring physician: [facility] [place],  [place] [id number]  HISTORICAL INFORMATION:   Selected notes from the MEDICAL RECORD NUMBER Referred by Dr. Marvel Plan LEE: 09/07/2020 Ocular Hx- high myope OU    CURRENT MEDICATIONS: No current outpatient medications on file. (Ophthalmic Drugs)   No current facility-administered medications for this visit. (Ophthalmic Drugs)   Current Outpatient Medications (Other)  Medication Sig   amLODipine (NORVASC) 10 MG tablet Take 1 tablet (10 mg total) by mouth daily.   atorvastatin (LIPITOR) 40 MG tablet TAKE 1 TABLET BY MOUTH EVERY DAY   Inositol Niacinate (NIACIN FLUSH FREE) 500 MG CAPS Take 1 capsule by mouth daily.   levothyroxine (SYNTHROID) 88 MCG tablet Take 1 tablet (88 mcg total) by mouth daily before breakfast.   metoprolol succinate (TOPROL-XL) 50 MG 24 hr tablet TAKE 1 TABLET BY MOUTH EVERY DAY   tadalafil (CIALIS) 5 MG tablet Take 1 tablet (5 mg total) by mouth daily as needed for erectile dysfunction.   No current facility-administered medications for this visit. (Other)       REVIEW OF SYSTEMS: ROS   Positive for: Eyes Negative for: Constitutional, Gastrointestinal, Neurological, Skin, Genitourinary, Musculoskeletal, HENT, Endocrine, Cardiovascular, Respiratory, Psychiatric, Allergic/Imm, Heme/Lymph Last edited by Doneen Poisson on 09/28/2020 10:55 AM.       ALLERGIES No Known Allergies  PAST MEDICAL HISTORY Past Medical History:  Diagnosis Date   Elevated blood pressure    Past Surgical History:  Procedure Laterality Date   VASECTOMY  2014    FAMILY HISTORY Family History  Problem Relation Age of Onset   Hypertension Father     SOCIAL HISTORY Social History   Tobacco Use   Smoking status: Never   Smokeless tobacco: Never  Substance Use Topics   Alcohol use: Yes    Comment: Occasional use   Drug use: No         OPHTHALMIC EXAM:  Base Eye Exam     Visual Acuity (Snellen - Linear)       Right Left   Dist cc 20/20 20/20    Correction: Contacts  -7.00 contact lenses         Tonometry (Tonopen, 11:11 AM)       Right Left   Pressure 17 19         Pupils       Dark Light Shape React APD   Right 4 3 Round Brisk 0   Left 4 3  Round Brisk 0         Visual Fields       Left Right    Full Full         Extraocular Movement       Right Left    Full Full         Neuro/Psych     Oriented x3: Yes   Mood/Affect: Normal         Dilation     Both eyes: 1.0% Mydriacyl, 2.5% Phenylephrine @ 11:11 AM           Slit Lamp and Fundus Exam     External Exam       Right Left   External Normal Normal         Slit Lamp Exam       Right Left   Lids/Lashes Mild meibomian gland dysfunction Mild meibomian gland dysfunction   Conjunctiva/Sclera White and quiet White and quiet   Cornea Trace debris in tear film Trace debris in tear film   Anterior Chamber Deep and quiet Deep and quiet   Iris Round and reactive Round and reactive   Lens Clear Clear   Vitreous Mild vitreous syneresis,  Vitreous condensations Mild vitreous syneresis, Posterior vitreous detachment, Vitreous condensations, Weiss ring         Fundus Exam       Right Left   Disc Pink and sharp, mild PPA Pink and sharp, tilted, temporal PPA   C/D Ratio 0.3 0.3   Macula Flat, Good foveal reflex, mild RPE mottling, no heme, no edema Flat, Good foveal reflex, mild RPE mottling, no heme, no edema   Vessels Normal Normal   Periphery Attached, mild Peripheral cystoid degeneration; no RT/RD Attached, mild Peripheral cystoid degeneration, mild pigmented pavingstone inferiorly, no RT/RD on 360 scleral depression            IMAGING AND PROCEDURES  Imaging and Procedures for 09/28/2020  OCT, Retina - OU - Both Eyes       Right Eye Quality was good. Central Foveal Thickness: 303. Progression has no prior data. Findings include normal foveal contour, no IRF, no SRF, vitreomacular adhesion , myopic contour.   Left Eye Quality was good. Central Foveal Thickness: 308. Progression has no prior data. Findings include normal foveal contour, no IRF, no SRF, myopic contour (Trace vitreous opacities ).   Notes *Images captured and stored on drive  Diagnosis / Impression:  OD: NFP; no IRF/SRF OS: Trace vitreous opacities   Clinical management:  See below  Abbreviations: NFP - Normal foveal profile. CME - cystoid macular edema. PED - pigment epithelial detachment. IRF - intraretinal fluid. SRF - subretinal fluid. EZ - ellipsoid zone. ERM - epiretinal membrane. ORA - outer retinal atrophy. ORT - outer retinal tubulation. SRHM - subretinal hyper-reflective material. IRHM - intraretinal hyper-reflective material            ASSESSMENT/PLAN:    ICD-10-CM   1. Posterior vitreous detachment of left eye  H43.812     2. Essential hypertension  I10     3. Hypertensive retinopathy of both eyes  H35.033     4. Retinal edema  H35.81 OCT, Retina - OU - Both Eyes    5. High myopia, bilateral  H52.13       1. PVD  / vitreous syneresis OU  - OS with symptomatic flashes/floaters x 1 day  - Discussed findings and prognosis  - No RT or RD on 360 scleral depressed exam  -  Reviewed s/s of RT/RD  - Strict return precautions for any such RT/RD signs/symptoms  - f/u in 4 wks -- DFE/OCT   2,3. Hypertensive retinopathy OU - discussed importance of tight BP control - monitor     4. No retinal edema on exam or OCT   5. High myopia w/ astigmatism OU - discussed association of high myopia with lattice degeneration and increased risk of RT/RD   Ophthalmic Meds Ordered this visit:  No orders of the defined types were placed in this encounter.      Return in 4 weeks (on 10/26/2020) for DFE, OCT.  There are no Patient Instructions on file for this visit.   Explained the diagnoses, plan, and follow up with the patient and they expressed understanding.  Patient expressed understanding of the importance of proper follow up care.   This document serves as a record of services personally performed by Gardiner Sleeper, MD, PhD. It was created on their behalf by Leonie Douglas, an ophthalmic technician. The creation of this record is the provider's dictation and/or activities during the visit.    Electronically signed by: Leonie Douglas COA, 09/28/20  1:11 PM   Gardiner Sleeper, M.D., Ph.D. Diseases & Surgery of the Retina and [place] 08.11.2022  I have reviewed the above documentation for accuracy and completeness, and I agree with the above. Gardiner Sleeper, M.D., Ph.D. 09/28/20 1:11 PM   Abbreviations: M myopia (nearsighted); A astigmatism; H hyperopia (farsighted); P presbyopia; Mrx spectacle prescription;  CTL contact lenses; OD right eye; OS left eye; OU both eyes  XT exotropia; ET esotropia; PEK punctate epithelial keratitis; PEE punctate epithelial erosions; DES dry eye syndrome; MGD meibomian gland dysfunction; ATs artificial tears; PFAT's preservative free artificial  tears; [place] nuclear sclerotic cataract; PSC posterior subcapsular cataract; ERM epi-retinal membrane; PVD posterior vitreous detachment; RD retinal detachment; DM diabetes mellitus; DR diabetic retinopathy; NPDR non-proliferative diabetic retinopathy; PDR proliferative diabetic retinopathy; CSME clinically significant macular edema; DME diabetic macular edema; dbh dot blot hemorrhages; CWS cotton wool spot; POAG primary open angle glaucoma; C/D cup-to-disc ratio; HVF humphrey visual field; GVF goldmann visual field; OCT optical coherence tomography; IOP intraocular pressure; BRVO Branch retinal vein occlusion; CRVO central retinal vein occlusion; CRAO central retinal artery occlusion; BRAO branch retinal artery occlusion; RT retinal tear; SB scleral buckle; PPV pars plana vitrectomy; VH Vitreous hemorrhage; PRP panretinal laser photocoagulation; IVK intravitreal kenalog; VMT vitreomacular traction; MH Macular hole;  NVD neovascularization of the disc; NVE neovascularization elsewhere; AREDS age related eye disease study; ARMD age related macular degeneration; POAG primary open angle glaucoma; EBMD epithelial/anterior basement membrane dystrophy; ACIOL anterior chamber intraocular lens; IOL intraocular lens; PCIOL posterior chamber intraocular lens; Phaco/IOL phacoemulsification with intraocular lens placement; [place] photorefractive keratectomy; LASIK laser assisted in situ keratomileusis; HTN hypertension; DM diabetes mellitus; COPD chronic obstructive pulmonary disease

## 2020-10-09 ENCOUNTER — Other Ambulatory Visit: Payer: Self-pay | Admitting: Family Medicine

## 2020-10-09 ENCOUNTER — Ambulatory Visit: Payer: BC Managed Care – PPO | Admitting: Family Medicine

## 2020-10-09 DIAGNOSIS — N529 Male erectile dysfunction, unspecified: Secondary | ICD-10-CM

## 2020-10-11 ENCOUNTER — Other Ambulatory Visit: Payer: Self-pay | Admitting: Family Medicine

## 2020-10-11 DIAGNOSIS — E782 Mixed hyperlipidemia: Secondary | ICD-10-CM

## 2020-10-11 DIAGNOSIS — I10 Essential (primary) hypertension: Secondary | ICD-10-CM

## 2020-10-11 NOTE — Telephone Encounter (Signed)
Requested Prescriptions  Pending Prescriptions Disp Refills  . metoprolol succinate (TOPROL-XL) 50 MG 24 hr tablet [Pharmacy Med Name: METOPROLOL SUCC ER 50 MG TAB] 90 tablet 0    Sig: TAKE 1 TABLET BY MOUTH EVERY DAY     Cardiovascular:  Beta Blockers Passed - 10/11/2020  4:13 PM      Passed - Last BP in normal range    BP Readings from Last 1 Encounters:  05/31/20 126/84         Passed - Last Heart Rate in normal range    Pulse Readings from Last 1 Encounters:  05/31/20 74         Passed - Valid encounter within last 6 months    Recent Outpatient Visits          4 months ago Primary hypertension   [facility], Laurita Quint, FNP   7 months ago Primary hypertension   [facility], Vickki Muff, PA-C   8 months ago Annual physical exam   [place], PA-C   11 months ago Mixed hyperlipidemia   [facility], Vickki Muff, PA-C   1 year ago Essential hypertension   [facility], Vickki Muff, PA-C             . atorvastatin (LIPITOR) 40 MG tablet [Pharmacy Med Name: ATORVASTATIN 40 MG TABLET] 90 tablet 1    Sig: TAKE 1 TABLET BY MOUTH EVERY DAY     Cardiovascular:  Antilipid - Statins Failed - 10/11/2020  4:13 PM      Failed - HDL in normal range and within 360 days    HDL  Date Value Ref Range Status  11/04/2019 32 (L) >39 mg/dL Final         Passed - Total Cholesterol in normal range and within 360 days    Cholesterol, Total  Date Value Ref Range Status  11/04/2019 125 100 - 199 mg/dL Final         Passed - LDL in normal range and within 360 days    LDL Chol Calc (NIH)  Date Value Ref Range Status  11/04/2019 68 0 - 99 mg/dL Final         Passed - Triglycerides in normal range and within 360 days    Triglycerides  Date Value Ref Range Status  11/04/2019 139 0 - 149 mg/dL Final         Passed - Patient is not pregnant      Passed - Valid encounter  within last 12 months    Recent Outpatient Visits          4 months ago Primary hypertension   [facility], Laurita Quint, FNP   7 months ago Primary hypertension   [facility], Vickki Muff, PA-C   8 months ago Annual physical exam   [facility], Vickki Muff, PA-C   11 months ago Mixed hyperlipidemia   [facility], Vickki Muff, PA-C   1 year ago Essential hypertension   [facility], Vickki Muff, [place]

## 2020-10-17 NOTE — Progress Notes (Signed)
[facility] Note  10/20/2020     CHIEF COMPLAINT Patient presents for Retina Follow Up   HISTORY OF PRESENT ILLNESS: Matthew York is a 40 y.o. male who presents to the clinic today for:   HPI     Retina Follow Up   Patient presents with  PVD.  In left eye.  This started 3 weeks ago.  I, the attending physician,  performed the HPI with the patient and updated documentation appropriately.        Comments   Patient here for 3 weeks retina follow up for PVD OS. Patient states vision can see. Mostly focus on stuff OS trouble  last time. Has a white floater in center that creates a haze. Feels like something in eye. No pain.  OD the same as usual.      Last edited by Bernarda Caffey, MD on 10/21/2020  5:48 PM.    Pt states he is no longer seeing fol in his left eye, but his left eye still has a floater and he has a haze in his vision   Referring physician: [facility] [place],  [place] [id number]  HISTORICAL INFORMATION:   Selected notes from the [facility] Referred by Dr. Marvel Plan LEE: 09/07/2020 Ocular Hx- high myope OU    CURRENT MEDICATIONS: No current outpatient medications on file. (Ophthalmic Drugs)   No current facility-administered medications for this visit. (Ophthalmic Drugs)   Current Outpatient Medications (Other)  Medication Sig   amLODipine (NORVASC) 10 MG tablet Take 1 tablet (10 mg total) by mouth daily.   atorvastatin (LIPITOR) 40 MG tablet TAKE 1 TABLET BY MOUTH EVERY DAY   Inositol Niacinate (NIACIN FLUSH FREE) 500 MG CAPS Take 1 capsule by mouth daily.   levothyroxine (SYNTHROID) 88 MCG tablet Take 1 tablet (88 mcg total) by mouth daily before breakfast.   metoprolol succinate (TOPROL-XL) 50 MG 24 hr tablet TAKE 1 TABLET BY MOUTH EVERY DAY   tadalafil (CIALIS) 5 MG tablet TAKE 1 TABLET BY MOUTH DAILY AS NEEDED FOR ERECTILE DYSFUNCTION   No current facility-administered medications for this  visit. (Other)      REVIEW OF SYSTEMS: ROS   Positive for: Eyes Negative for: Constitutional, Gastrointestinal, Neurological, Skin, Genitourinary, Musculoskeletal, HENT, Endocrine, Cardiovascular, Respiratory, Psychiatric, Allergic/Imm, Heme/Lymph Last edited by Theodore Demark, COA on 10/20/2020  7:54 AM.        ALLERGIES No Known Allergies  PAST MEDICAL HISTORY Past Medical History:  Diagnosis Date   Elevated blood pressure    Past Surgical History:  Procedure Laterality Date   VASECTOMY  2014    FAMILY HISTORY Family History  Problem Relation Age of Onset   Hypertension Father     SOCIAL HISTORY Social History   Tobacco Use   Smoking status: Never   Smokeless tobacco: Never  Substance Use Topics   Alcohol use: Yes    Comment: Occasional use   Drug use: No         OPHTHALMIC EXAM:  Base Eye Exam     Visual Acuity (Snellen - Linear)       Right Left   Dist cc 20/20 -1 20/20    Correction: Contacts         Tonometry (Tonopen, 7:50 AM)       Right Left   Pressure 19 18         Pupils       Dark Light Shape React APD  Right 4 3 Round Brisk None   Left 4 3 Round Brisk None         Visual Fields (Counting fingers)       Left Right    Full Full         Extraocular Movement       Right Left    Full Full         Neuro/Psych     Oriented x3: Yes   Mood/Affect: Normal         Dilation     Both eyes: 1.0% Mydriacyl, 2.5% Phenylephrine @ 7:50 AM           Slit Lamp and Fundus Exam     External Exam       Right Left   External Normal Normal         Slit Lamp Exam       Right Left   Lids/Lashes Mild meibomian gland dysfunction Mild meibomian gland dysfunction   Conjunctiva/Sclera White and quiet White and quiet   Cornea Trace debris in tear film Trace debris in tear film   Anterior Chamber Deep and quiet Deep and quiet   Iris Round and reactive Round and reactive   Lens Clear Clear   Vitreous Mild  vitreous syneresis, Vitreous condensations Mild vitreous syneresis, Posterior vitreous detachment, Vitreous condensations, Weiss ring         Fundus Exam       Right Left   Disc Pink and sharp, mild PPA Pink and sharp, tilted, temporal PPA   C/D Ratio 0.3 0.3   Macula Flat, Good foveal reflex, mild RPE mottling, no heme, no edema Flat, Good foveal reflex, mild RPE mottling, no heme, no edema   Vessels Normal Normal   Periphery Attached, mild Peripheral cystoid degeneration; no RT/RD Attached, mild Peripheral cystoid degeneration, mild pigmented pavingstone inferiorly, no RT/RD             IMAGING AND PROCEDURES  Imaging and Procedures for 10/20/2020  OCT, Retina - OU - Both Eyes       Right Eye Quality was good. Central Foveal Thickness: 296. Progression has been stable. Findings include normal foveal contour, no IRF, no SRF, vitreomacular adhesion , myopic contour.   Left Eye Quality was good. Central Foveal Thickness: 292. Progression has been stable. Findings include normal foveal contour, no IRF, no SRF, myopic contour.   Notes *Images captured and stored on drive  Diagnosis / Impression:  NFP; no IRF/SRF OU   Clinical management:  See below  Abbreviations: NFP - Normal foveal profile. CME - cystoid macular edema. PED - pigment epithelial detachment. IRF - intraretinal fluid. SRF - subretinal fluid. EZ - ellipsoid zone. ERM - epiretinal membrane. ORA - outer retinal atrophy. ORT - outer retinal tubulation. SRHM - subretinal hyper-reflective material. IRHM - intraretinal hyper-reflective material             ASSESSMENT/PLAN:    ICD-10-CM   1. Posterior vitreous detachment of left eye  H43.812     2. Essential hypertension  I10     3. Hypertensive retinopathy of both eyes  H35.033     4. Retinal edema  H35.81 OCT, Retina - OU - Both Eyes    5. High myopia, bilateral  H52.13        1. PVD / vitreous syneresis OS  - OS with symptomatic flashes/floaters  -- improving symptoms  - Discussed findings and prognosis  - No RT or RD on 360 scleral  depressed exam  - Reviewed s/s of RT/RD  - Strict return precautions for any such RT/RD signs/symptoms   - pt is cleared from a retina standpoint for release to Dr. Marvel Plan and resumption of primary eye care   2,3. Hypertensive retinopathy OU - discussed importance of tight BP control - monitor   4. No retinal edema on exam or OCT   5. High myopia w/ astigmatism OU - discussed association of high myopia with lattice degeneration and increased risk of RT/RD   Ophthalmic Meds Ordered this visit:  No orders of the defined types were placed in this encounter.      Return if symptoms worsen or fail to improve.  There are no Patient Instructions on file for this visit.   Explained the diagnoses, plan, and follow up with the patient and they expressed understanding.  Patient expressed understanding of the importance of proper follow up care.   This document serves as a record of services personally performed by Gardiner Sleeper, MD, PhD. It was created on their behalf by Leonie Douglas, an ophthalmic technician. The creation of this record is the provider's dictation and/or activities during the visit.    Electronically signed by: Leonie Douglas COA, 10/21/20  5:49 PM  This document serves as a record of services personally performed by Gardiner Sleeper, MD, PhD. It was created on their behalf by San Jetty. Owens Shark, OA an ophthalmic technician. The creation of this record is the provider's dictation and/or activities during the visit.    Electronically signed by: San Jetty. Marguerita Merles 09.02.2022 5:49 PM   Gardiner Sleeper, M.D., Ph.D. Diseases & Surgery of the Retina and [place] 10/20/2020  I have reviewed the above documentation for accuracy and completeness, and I agree with the above. Gardiner Sleeper, M.D., Ph.D. 10/21/20 5:50 PM   Abbreviations: M myopia  (nearsighted); A astigmatism; H hyperopia (farsighted); P presbyopia; Mrx spectacle prescription;  CTL contact lenses; OD right eye; OS left eye; OU both eyes  XT exotropia; ET esotropia; PEK punctate epithelial keratitis; PEE punctate epithelial erosions; DES dry eye syndrome; MGD meibomian gland dysfunction; ATs artificial tears; PFAT's preservative free artificial tears; [place] nuclear sclerotic cataract; PSC posterior subcapsular cataract; ERM epi-retinal membrane; PVD posterior vitreous detachment; RD retinal detachment; DM diabetes mellitus; DR diabetic retinopathy; NPDR non-proliferative diabetic retinopathy; PDR proliferative diabetic retinopathy; CSME clinically significant macular edema; DME diabetic macular edema; dbh dot blot hemorrhages; CWS cotton wool spot; POAG primary open angle glaucoma; C/D cup-to-disc ratio; HVF humphrey visual field; GVF goldmann visual field; OCT optical coherence tomography; IOP intraocular pressure; BRVO Branch retinal vein occlusion; CRVO central retinal vein occlusion; CRAO central retinal artery occlusion; BRAO branch retinal artery occlusion; RT retinal tear; SB scleral buckle; PPV pars plana vitrectomy; VH Vitreous hemorrhage; PRP panretinal laser photocoagulation; IVK intravitreal kenalog; VMT vitreomacular traction; MH Macular hole;  NVD neovascularization of the disc; NVE neovascularization elsewhere; AREDS age related eye disease study; ARMD age related macular degeneration; POAG primary open angle glaucoma; EBMD epithelial/anterior basement membrane dystrophy; ACIOL anterior chamber intraocular lens; IOL intraocular lens; PCIOL posterior chamber intraocular lens; Phaco/IOL phacoemulsification with intraocular lens placement; [place] photorefractive keratectomy; LASIK laser assisted in situ keratomileusis; HTN hypertension; DM diabetes mellitus; COPD chronic obstructive pulmonary disease

## 2020-10-20 ENCOUNTER — Encounter (INDEPENDENT_AMBULATORY_CARE_PROVIDER_SITE_OTHER): Payer: BC Managed Care – PPO | Admitting: Ophthalmology

## 2020-10-20 ENCOUNTER — Encounter (INDEPENDENT_AMBULATORY_CARE_PROVIDER_SITE_OTHER): Payer: Self-pay | Admitting: Ophthalmology

## 2020-10-20 ENCOUNTER — Ambulatory Visit (INDEPENDENT_AMBULATORY_CARE_PROVIDER_SITE_OTHER): Payer: BC Managed Care – PPO | Admitting: Ophthalmology

## 2020-10-20 ENCOUNTER — Other Ambulatory Visit: Payer: Self-pay

## 2020-10-20 DIAGNOSIS — I10 Essential (primary) hypertension: Secondary | ICD-10-CM

## 2020-10-20 DIAGNOSIS — H35033 Hypertensive retinopathy, bilateral: Secondary | ICD-10-CM

## 2020-10-20 DIAGNOSIS — H43812 Vitreous degeneration, left eye: Secondary | ICD-10-CM | POA: Diagnosis not present

## 2020-10-20 DIAGNOSIS — H3581 Retinal edema: Secondary | ICD-10-CM

## 2020-10-20 DIAGNOSIS — H5213 Myopia, bilateral: Secondary | ICD-10-CM

## 2020-10-21 ENCOUNTER — Encounter (INDEPENDENT_AMBULATORY_CARE_PROVIDER_SITE_OTHER): Payer: Self-pay | Admitting: Ophthalmology

## 2020-12-12 ENCOUNTER — Other Ambulatory Visit: Payer: Self-pay | Admitting: Family Medicine

## 2020-12-12 DIAGNOSIS — I10 Essential (primary) hypertension: Secondary | ICD-10-CM

## 2020-12-12 NOTE — Telephone Encounter (Signed)
Call to patient - he needs follow up appointment- should [facility] at [facility]- will call them to verify. Call to pharmacy- patient has 2 RF- they will fill for pick up

## 2020-12-12 NOTE — Telephone Encounter (Signed)
Medication Refill - Medication: Amlodipine   Has the patient contacted their pharmacy? Yes.   PT states that they did not have this on file. Please advise.  (Agent: If no, request that the patient contact the pharmacy for the refill. If patient does not wish to contact the pharmacy document the reason why and proceed with request.) (Agent: If yes, when and what did the pharmacy advise?)  Preferred Pharmacy (with phone number or street name):  [id number] [name] [address]  [address] [place] [address]  Phone: [phone] Fax: [phone]  Hours: Not open 24 hours   Has the patient been seen for an appointment in the last year OR does the patient have an upcoming appointment? Yes.    Agent: Please be advised that RX refills may take up to 3 business days. We ask that you follow-up with your pharmacy.

## 2020-12-22 ENCOUNTER — Other Ambulatory Visit: Payer: Self-pay

## 2020-12-22 ENCOUNTER — Encounter: Payer: Self-pay | Admitting: Family Medicine

## 2020-12-22 ENCOUNTER — Ambulatory Visit: Payer: BC Managed Care – PPO | Admitting: Family Medicine

## 2020-12-22 VITALS — BP 124/104 | HR 72 | Temp 98.8°F | Resp 18 | Wt 213.0 lb

## 2020-12-22 DIAGNOSIS — I10 Essential (primary) hypertension: Secondary | ICD-10-CM | POA: Diagnosis not present

## 2020-12-22 DIAGNOSIS — Z23 Encounter for immunization: Secondary | ICD-10-CM

## 2020-12-22 DIAGNOSIS — E782 Mixed hyperlipidemia: Secondary | ICD-10-CM

## 2020-12-22 DIAGNOSIS — E039 Hypothyroidism, unspecified: Secondary | ICD-10-CM | POA: Diagnosis not present

## 2020-12-22 NOTE — Patient Instructions (Signed)
.   Please review the attached list of medications and notify my office if there are any errors.   . Please bring all of your medications to every appointment so we can make sure that our medication list is the same as yours.   . Please go to the lab draw station in [address] on the second floor of [facility]  when you are fasting for 8 hours. Normal hours are 8:00am to 11:30am and 1:00pm to 4:00pm Monday through Friday   

## 2020-12-22 NOTE — Progress Notes (Signed)
Established patient visit   Patient: Matthew York   DOB: [birth year]   40 y.o. Male  MRN: [id number] Visit Date: 12/22/2020  Today's healthcare provider: Lelon Huh, MD   Chief Complaint  Patient presents with   Hypertension   Hypothyroidism   Subjective    HPI  Hypertension, follow-up  BP Readings from Last 3 Encounters:  12/22/20 (!) 124/104  05/31/20 126/84  03/14/20 (!) 134/101   Wt Readings from Last 3 Encounters:  12/22/20 213 lb (96.6 kg)  05/31/20 210 lb (95.3 kg)  03/14/20 235 lb (106.6 kg)     He was last seen for hypertension 7 months ago.  BP at that visit was 126/84. Management since that visit includes ordering labs; patient did not complete lab.  He reports fair compliance with treatment. Patient ran out of medication for 2 months. He recently restarted medication 1 week ago. He is not having side effects.  He is following a Regular diet. He is exercising. He does not smoke.  Use of agents associated with hypertension: thyroid hormones.   Outside blood pressures are not checked. Symptoms: No chest pain No chest pressure  No palpitations No syncope  No dyspnea No orthopnea  No paroxysmal nocturnal dyspnea No lower extremity edema   Pertinent labs: Lab Results  Component Value Date   CHOL 125 11/04/2019   HDL 32 (L) 11/04/2019   LDLCALC 68 11/04/2019   TRIG 139 11/04/2019   CHOLHDL 3.9 11/04/2019   Lab Results  Component Value Date   NA 140 11/04/2019   K 4.1 11/04/2019   CREATININE 1.11 11/04/2019   GFRNONAA 82 11/04/2019   GLUCOSE 85 11/04/2019   TSH 2.500 11/04/2019     The ASCVD Risk score (Arnett DK, et al., 2019) failed to calculate for the following reasons:   The valid total cholesterol range is 130 to 320 mg/dL   ---------------------------------------------------------------------------------------------------   Hypothyroid, follow-up  Lab Results  Component Value Date   TSH 2.500 11/04/2019   TSH 4.030 07/30/2019    TSH 3.910 12/18/2018   T4TOTAL 7.1 07/30/2019   Wt Readings from Last 3 Encounters:  12/22/20 213 lb (96.6 kg)  05/31/20 210 lb (95.3 kg)  03/14/20 235 lb (106.6 kg)    He was last seen for hypothyroid 7 months ago.  Management since that visit includes ordering labs. Patient did not complete labs. He reports good compliance with treatment. He is not having side effects.   Symptoms: No change in energy level No constipation  No diarrhea No heat / cold intolerance  No nervousness No palpitations  No weight changes    -----------------------------------------------------------------------------------------   Lipid/Cholesterol, Follow-up  Last lipid panel Other pertinent labs  Lab Results  Component Value Date   CHOL 125 11/04/2019   HDL 32 (L) 11/04/2019   LDLCALC 68 11/04/2019   TRIG 139 11/04/2019   CHOLHDL 3.9 11/04/2019   Lab Results  Component Value Date   ALT 32 11/04/2019   AST 18 11/04/2019   PLT 247 11/04/2019   TSH 2.500 11/04/2019     He was last seen for this 7 months ago.  Management since that visit includes ordering labs.  He reports good compliance with treatment. He is not having side effects.   Symptoms: No chest pain No chest pressure/discomfort  No dyspnea No lower extremity edema  No numbness or tingling of extremity No orthopnea  No palpitations No paroxysmal nocturnal dyspnea  No speech difficulty No syncope  Current diet: well balanced Current exercise: walking  The ASCVD Risk score (Arnett DK, et al., 2019) failed to calculate for the following reasons:   The valid total cholesterol range is 130 to 320 mg/dL  ---------------------------------------------------------------------------------------------------     Medications: Outpatient Medications Prior to Visit  Medication Sig   amLODipine (NORVASC) 10 MG tablet Take 1 tablet (10 mg total) by mouth daily.   atorvastatin (LIPITOR) 40 MG tablet TAKE 1 TABLET BY MOUTH EVERY  DAY   levothyroxine (SYNTHROID) 88 MCG tablet Take 1 tablet (88 mcg total) by mouth daily before breakfast.   metoprolol succinate (TOPROL-XL) 50 MG 24 hr tablet TAKE 1 TABLET BY MOUTH EVERY DAY   tadalafil (CIALIS) 5 MG tablet TAKE 1 TABLET BY MOUTH DAILY AS NEEDED FOR ERECTILE DYSFUNCTION   Inositol Niacinate (NIACIN FLUSH FREE) 500 MG CAPS Take 1 capsule by mouth daily. (Patient not taking: Reported on 12/22/2020)   No facility-administered medications prior to visit.    Review of Systems  Constitutional:  Negative for appetite change, chills and fever.  Respiratory:  Negative for chest tightness, shortness of breath and wheezing.   Cardiovascular:  Negative for chest pain and palpitations.  Gastrointestinal:  Negative for abdominal pain, nausea and vomiting.      Objective    BP (!) 124/104 (BP Location: Right Arm, Patient Position: Sitting)   Pulse 72   Temp 98.8 F (37.1 C) (Oral)   Resp 18   Wt 213 lb (96.6 kg)   SpO2 99% Comment: room air  BMI 28.10 kg/m  {Show previous vital signs (optional):23777}  Physical Exam   General: Appearance:     Well developed, well nourished male in no acute distress  Eyes:    PERRL, conjunctiva/corneas clear, EOM's intact       Lungs:     Clear to auscultation bilaterally, respirations unlabored  Heart:    Normal heart rate. Normal rhythm. No murmurs, rubs, or gallops.    MS:   All extremities are intact.    Neurologic:   Awake, alert, oriented x 3. No apparent focal neurological defect.          Assessment & Plan     1. Primary hypertension Doing well on current medications, but only just restarted. Continue for now and will follow up when he been back on for a few months.   2. Hypothyroidism, unspecified type  - T4, free - TSH  3. Mixed hyperlipidemia He is tolerating atorvastatin well with no adverse effects.   - Comprehensive metabolic panel - Lipid panel  4. Need for influenza vaccination  - Flu Vaccine QUAD 36+ mos  IM (Fluarix/Fluzone)        The entirety of the information documented in the History of Present Illness, Review of Systems and Physical Exam were personally obtained by me. Portions of this information were initially documented by the CMA and reviewed by me for thoroughness and accuracy.     Lelon Huh, MD  [facility] [phone] (phone) [phone] (fax)  [place]

## 2021-01-08 ENCOUNTER — Telehealth: Payer: Self-pay | Admitting: Physician Assistant

## 2021-01-08 ENCOUNTER — Other Ambulatory Visit: Payer: Self-pay

## 2021-01-08 DIAGNOSIS — I10 Essential (primary) hypertension: Secondary | ICD-10-CM

## 2021-01-08 NOTE — Telephone Encounter (Signed)
[facility] faxed refill request for the following medications:  metoprolol succinate (TOPROL-XL) 50 MG 24 hr tablet   Please advise.  

## 2021-01-09 MED ORDER — METOPROLOL SUCCINATE ER 50 MG PO TB24: 50.0000 mg | ORAL_TABLET | Freq: Every day | ORAL | 0 refills | Status: DC

## 2021-01-31 ENCOUNTER — Other Ambulatory Visit: Payer: Self-pay | Admitting: Physician Assistant

## 2021-01-31 DIAGNOSIS — N529 Male erectile dysfunction, unspecified: Secondary | ICD-10-CM

## 2021-01-31 MED ORDER — TADALAFIL 5 MG PO TABS: 5.0000 mg | ORAL_TABLET | Freq: Every day | ORAL | 0 refills | Status: DC | PRN

## 2021-01-31 NOTE — Telephone Encounter (Signed)
[facility] faxed refill request for the following medications:  tadalafil (CIALIS) 5 MG tablet    Please advise.  

## 2021-03-23 ENCOUNTER — Telehealth: Payer: Self-pay | Admitting: Physician Assistant

## 2021-03-23 DIAGNOSIS — I10 Essential (primary) hypertension: Secondary | ICD-10-CM

## 2021-03-23 NOTE — Telephone Encounter (Signed)
[facility] faxed refill request for the following medications:  atorvastatin (LIPITOR) 40 MG tablet   Please advise.       [facility]  [address]  [place] [phone]

## 2021-03-27 ENCOUNTER — Other Ambulatory Visit: Payer: Self-pay

## 2021-03-27 DIAGNOSIS — E782 Mixed hyperlipidemia: Secondary | ICD-10-CM

## 2021-03-27 MED ORDER — ATORVASTATIN CALCIUM 40 MG PO TABS: 40.0000 mg | ORAL_TABLET | Freq: Every day | ORAL | 0 refills | Status: DC

## 2021-03-27 NOTE — Telephone Encounter (Signed)
Received another [facility] from [facility] on [address] Kinnie Feil atorvastatin (LIPITOR) 40 MG tablet as stated in last message. Please advise.

## 2021-05-07 ENCOUNTER — Other Ambulatory Visit: Payer: Self-pay | Admitting: Physician Assistant

## 2021-05-07 DIAGNOSIS — N529 Male erectile dysfunction, unspecified: Secondary | ICD-10-CM

## 2021-06-17 ENCOUNTER — Other Ambulatory Visit: Payer: Self-pay | Admitting: Physician Assistant

## 2021-06-17 DIAGNOSIS — I10 Essential (primary) hypertension: Secondary | ICD-10-CM

## 2021-06-19 ENCOUNTER — Other Ambulatory Visit: Payer: Self-pay | Admitting: Physician Assistant

## 2021-06-19 DIAGNOSIS — N529 Male erectile dysfunction, unspecified: Secondary | ICD-10-CM

## 2021-07-02 ENCOUNTER — Other Ambulatory Visit: Payer: Self-pay | Admitting: Physician Assistant

## 2021-07-02 DIAGNOSIS — I10 Essential (primary) hypertension: Secondary | ICD-10-CM

## 2021-07-11 ENCOUNTER — Other Ambulatory Visit: Payer: Self-pay | Admitting: Physician Assistant

## 2021-07-11 ENCOUNTER — Telehealth: Payer: Self-pay | Admitting: Physician Assistant

## 2021-07-11 DIAGNOSIS — E039 Hypothyroidism, unspecified: Secondary | ICD-10-CM

## 2021-07-11 DIAGNOSIS — E782 Mixed hyperlipidemia: Secondary | ICD-10-CM

## 2021-07-11 MED ORDER — LEVOTHYROXINE SODIUM 88 MCG PO TABS: 88.0000 ug | ORAL_TABLET | Freq: Every day | ORAL | 0 refills | Status: DC

## 2021-07-11 NOTE — Telephone Encounter (Signed)
[facility] for the following medications:  levothyroxine (SYNTHROID) 88 MCG tablet   Please advise

## 2021-07-23 ENCOUNTER — Encounter: Payer: Self-pay | Admitting: Podiatry

## 2021-07-23 ENCOUNTER — Ambulatory Visit (INDEPENDENT_AMBULATORY_CARE_PROVIDER_SITE_OTHER): Payer: BC Managed Care – PPO

## 2021-07-23 ENCOUNTER — Ambulatory Visit: Payer: BC Managed Care – PPO | Admitting: Podiatry

## 2021-07-23 DIAGNOSIS — M76821 Posterior tibial tendinitis, right leg: Secondary | ICD-10-CM

## 2021-07-23 DIAGNOSIS — M778 Other enthesopathies, not elsewhere classified: Secondary | ICD-10-CM | POA: Diagnosis not present

## 2021-07-23 DIAGNOSIS — Q666 Other congenital valgus deformities of feet: Secondary | ICD-10-CM

## 2021-07-23 MED ORDER — METHYLPREDNISOLONE 4 MG PO TBPK: ORAL_TABLET | ORAL | 0 refills | Status: DC

## 2021-07-23 MED ORDER — MELOXICAM 15 MG PO TABS: 15.0000 mg | ORAL_TABLET | Freq: Every day | ORAL | 3 refills | Status: DC

## 2021-07-23 MED ORDER — DEXAMETHASONE SODIUM PHOSPHATE 120 MG/30ML IJ SOLN
2.0000 mg | Freq: Once | INTRAMUSCULAR | Status: AC
  Administered 2021-07-23: 2 mg via INTRA_ARTICULAR

## 2021-07-23 NOTE — Progress Notes (Signed)
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 male presents with the above complaint.   ROS: Denies fever chills nausea vomiting muscle aches pains calf pain back pain chest pain shortness of breath.  Past Medical History:  Diagnosis Date   Elevated blood pressure    Past Surgical History:  Procedure Laterality Date   VASECTOMY  2014    Current Outpatient Medications:    meloxicam (MOBIC) 15 MG tablet, Take 1 tablet (15 mg total) by mouth daily., Disp: 30 tablet, Rfl: 3   methylPREDNISolone (MEDROL DOSEPAK) 4 MG TBPK tablet, 6 day dose pack - take as directed, Disp: 21 tablet, Rfl: 0   amLODipine (NORVASC) 10 MG tablet, TAKE 1 TABLET BY MOUTH EVERY DAY, Disp: 90 tablet, Rfl: 0   atorvastatin (LIPITOR) 40 MG tablet, TAKE 1 TABLET BY MOUTH EVERY DAY, Disp: 30 tablet, Rfl: 0   levothyroxine (SYNTHROID) 88 MCG tablet, Take 1 tablet (88 mcg total) by mouth daily before breakfast., Disp: 30 tablet, Rfl: 0   metoprolol succinate (TOPROL-XL) 50 MG 24 hr tablet, TAKE 1 TABLET BY MOUTH EVERY DAY WITH OR IMMEDIATELY FOLLOWING A MEAL, Disp: 30 tablet, Rfl: 0   tadalafil (CIALIS) 5 MG tablet, TAKE 1 TABLET BY MOUTH DAILY AS NEEDED FOR ERECTILE DYSFUNCTION., Disp: 30 tablet, Rfl: 0   tobramycin-dexamethasone (TOBRADEX) ophthalmic solution, SMARTSIG:2 Drop(s) Left Eye Every 4 Hours, Disp: , Rfl:   No Known Allergies Review of Systems Objective:  There were no vitals filed for this visit.  General: Well developed, nourished, in no acute distress, alert and oriented x3   Dermatological: Skin is warm, dry and supple bilateral. Nails x 10 are well maintained; remaining integument appears unremarkable at this time. There are no open  sores, no preulcerative lesions, no rash or signs of infection present.  Vascular: Dorsalis Pedis artery and Posterior Tibial artery pedal pulses are 2/4 bilateral with immedate capillary fill time. Pedal hair growth present. No varicosities and no lower extremity edema present bilateral.   Neruologic: Grossly intact via light touch bilateral. Vibratory intact via tuning fork bilateral. Protective threshold with Semmes Wienstein monofilament intact to all pedal sites bilateral. Patellar and Achilles deep tendon reflexes 2+ bilateral. No Babinski or clonus noted bilateral.   Musculoskeletal: No gross boney pedal deformities bilateral. No pain, crepitus, or limitation noted with foot and ankle range of motion bilateral. Muscular strength 5/5 in all groups tested bilateral.  Mild tenderness on palpation of the posterior tibial tendon at its insertion site of the navicular tuberosity he has mild tenderness with inversion against resistance.  Gait: Unassisted, Nonantalgic.    Radiographs:  Radiographs taken today 4 views demonstrate an osseously mature individual normal ankle mortise is visible.  He also a prominent navicular tuberosity with what appears to be a type II os naviculare.  There is some soft tissue swelling at the posterior tibial tendon insertion site.  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  Plan: I injected the area today with dexamethasone local anesthetic a total of 2 mg was injected injected into the subcutaneous tissue however did not inject into the tendon itself.  Started him on methylprednisolone to be followed by meloxicam.  And will have him to see Aaron Edelman for orthotics.     Idy Rawling T. [place], [place]

## 2021-07-24 ENCOUNTER — Other Ambulatory Visit: Payer: Self-pay | Admitting: Physician Assistant

## 2021-07-24 DIAGNOSIS — N529 Male erectile dysfunction, unspecified: Secondary | ICD-10-CM

## 2021-08-03 ENCOUNTER — Other Ambulatory Visit: Payer: Self-pay | Admitting: Physician Assistant

## 2021-08-03 DIAGNOSIS — E782 Mixed hyperlipidemia: Secondary | ICD-10-CM

## 2021-08-03 DIAGNOSIS — I10 Essential (primary) hypertension: Secondary | ICD-10-CM

## 2021-08-03 DIAGNOSIS — E039 Hypothyroidism, unspecified: Secondary | ICD-10-CM

## 2021-08-03 NOTE — Telephone Encounter (Signed)
Requested Prescriptions  Pending Prescriptions Disp Refills  . levothyroxine (SYNTHROID) 88 MCG tablet [Pharmacy Med Name: LEVOTHYROXINE 88 MCG TABLET] 30 tablet 0    Sig: TAKE 1 TABLET BY MOUTH DAILY BEFORE BREAKFAST.     Endocrinology:  Hypothyroid Agents Failed - 08/03/2021  1:35 PM      Failed - TSH in normal range and within 360 days    TSH  Date Value Ref Range Status  11/04/2019 2.500 0.450 - 4.500 uIU/mL Final         Passed - Valid encounter within last 12 months    Recent Outpatient Visits          7 months ago Primary hypertension   [facility] Birdie Sons, MD   1 year ago Primary hypertension   [place] [facility], Laurita Quint, FNP   1 year ago Primary hypertension   [facility], Vickki Muff, PA-C   1 year ago Annual physical exam   [facility], Vickki Muff, PA-C   1 year ago Mixed hyperlipidemia   [facility], Vickki Muff, PA-C      Future Appointments            In 5 months Ralene Bathe, MD [place]           . metoprolol succinate (TOPROL-XL) 50 MG 24 hr tablet [Pharmacy Med Name: METOPROLOL SUCC ER 50 MG TAB] 30 tablet 0    Sig: TAKE 1 TABLET BY MOUTH EVERY DAY WITH OR IMMEDIATELY FOLLOWING A MEAL     Cardiovascular:  Beta Blockers Failed - 08/03/2021  1:35 PM      Failed - Last BP in normal range    BP Readings from Last 1 Encounters:  12/22/20 (!) 124/104         Failed - Valid encounter within last 6 months    Recent Outpatient Visits          7 months ago Primary hypertension   [facility] Birdie Sons, MD   1 year ago Primary hypertension   [place] [facility], Laurita Quint, [place]   1 year ago Primary hypertension   [facility], Vickki Muff, PA-C   1 year ago Annual physical exam   [facility], Vickki Muff, PA-C   1 year ago Mixed hyperlipidemia   [facility], Vickki Muff, PA-C      Future Appointments            In 5 months Ralene Bathe, MD [place] Heart Rate in normal range    Pulse Readings from Last 1 Encounters:  12/22/20 72         . atorvastatin (LIPITOR) 40 MG tablet [Pharmacy Med Name: ATORVASTATIN 40 MG TABLET] 30 tablet 0    Sig: TAKE 1 TABLET BY MOUTH EVERY DAY     Cardiovascular:  Antilipid - Statins Failed - 08/03/2021  1:35 PM      Failed - Lipid Panel in normal range within the last 12 months    Cholesterol, Total  Date Value Ref Range Status  11/04/2019 125 100 - 199 mg/dL Final   LDL Chol Calc (NIH)  Date Value Ref Range Status  11/04/2019 68 0 - 99 mg/dL Final   HDL  Date Value Ref Range Status  11/04/2019 32 (L) >39 mg/dL Final  Triglycerides  Date Value Ref Range Status  11/04/2019 139 0 - 149 mg/dL Final         Passed - Patient is not pregnant      Passed - Valid encounter within last 12 months    Recent Outpatient Visits          7 months ago Primary hypertension   [facility] Birdie Sons, MD   1 year ago Primary hypertension   [place], Laurita Quint, FNP   1 year ago Primary hypertension   [facility], Vickki Muff, PA-C   1 year ago Annual physical exam   [facility], Vickki Muff, PA-C   1 year ago Mixed hyperlipidemia   [facility], Vickki Muff, PA-C      Future Appointments            In 5 months Ralene Bathe, MD [place]

## 2021-08-06 ENCOUNTER — Ambulatory Visit: Payer: BC Managed Care – PPO

## 2021-08-06 DIAGNOSIS — M76821 Posterior tibial tendinitis, right leg: Secondary | ICD-10-CM | POA: Diagnosis not present

## 2021-08-06 DIAGNOSIS — Q666 Other congenital valgus deformities of feet: Secondary | ICD-10-CM

## 2021-08-06 NOTE — Progress Notes (Signed)
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 Posterior tibial tendinitis of right lower extremity  M76.821       Current or Previous Devices:   None and no history  Foot Examination: Skin presentation:   intact Ulcers & Callousing:   none Toe / Foot Deformities:  Pes planus Weight Bearing Presentation:  planus Sensation:    Intact  Shoe Size:    11  ORTHOTIC RECOMMENDATION Recommended Device: 1x pair of custom functional foot orthotics  GOALS OF ORTHOSES - Reduce Pain - Prevent Foot Deformity - Prevent Progression of Further Foot Deformity - Relieve Pressure - Improve the Overall Biomechanical Function of the Foot and Lower Extremity.  ACTIONS PERFORMED Potential out of pocket cost was communicated to patient. Patient understood and consent to casting. Patient was casted for Foot Orthoses via crush box. Procedure was explained and patient tolerated procedure well. Casts were shipped to central fabrication. All questions were answered and concerns addressed.  PLAN Patient is to be called for fitting when devices are ready.

## 2021-08-17 ENCOUNTER — Other Ambulatory Visit: Payer: Self-pay | Admitting: Physician Assistant

## 2021-08-17 DIAGNOSIS — E782 Mixed hyperlipidemia: Secondary | ICD-10-CM

## 2021-08-19 ENCOUNTER — Other Ambulatory Visit: Payer: Self-pay | Admitting: Physician Assistant

## 2021-08-19 DIAGNOSIS — E039 Hypothyroidism, unspecified: Secondary | ICD-10-CM

## 2021-08-21 ENCOUNTER — Other Ambulatory Visit: Payer: Self-pay | Admitting: Physician Assistant

## 2021-08-21 DIAGNOSIS — I10 Essential (primary) hypertension: Secondary | ICD-10-CM

## 2021-08-25 ENCOUNTER — Other Ambulatory Visit: Payer: Self-pay | Admitting: Physician Assistant

## 2021-08-25 DIAGNOSIS — N529 Male erectile dysfunction, unspecified: Secondary | ICD-10-CM

## 2021-08-27 NOTE — Telephone Encounter (Signed)
Requested medication (s) are due for refill today:yes  Requested medication (s) are on the active medication list: yes  Last refill:  07/24/21 #30  Future visit scheduled: no  Notes to clinic:   called pt and LM on VM to call office to schedule F/U appointment    Requested Prescriptions  Pending Prescriptions Disp Refills   tadalafil (CIALIS) 5 MG tablet [Pharmacy Med Name: TADALAFIL 5 MG TABLET] 30 tablet 0    Sig: TAKE 1 TABLET BY MOUTH DAILY AS NEEDED FOR ERECTILE DYSFUNCTION.     Urology: Erectile Dysfunction Agents Failed - 08/25/2021  1:16 AM      Failed - AST in normal range and within 360 days    AST  Date Value Ref Range Status  11/04/2019 18 0 - 40 IU/L Final         Failed - ALT in normal range and within 360 days    ALT  Date Value Ref Range Status  11/04/2019 32 0 - 44 IU/L Final         Failed - Last BP in normal range    BP Readings from Last 1 Encounters:  12/22/20 (!) 124/104         Passed - Valid encounter within last 12 months    Recent Outpatient Visits           8 months ago Primary hypertension   [facility] Birdie Sons, MD   1 year ago Primary hypertension   [place] [facility], Laurita Quint, FNP   1 year ago Primary hypertension   [facility], Vickki Muff, PA-C   1 year ago Annual physical exam   [facility], Vickki Muff, PA-C   1 year ago Mixed hyperlipidemia   [facility], Vickki Muff, PA-C       Future Appointments             In 4 months Ralene Bathe, MD [place]

## 2021-09-17 ENCOUNTER — Other Ambulatory Visit: Payer: BC Managed Care – PPO

## 2021-09-17 ENCOUNTER — Ambulatory Visit (INDEPENDENT_AMBULATORY_CARE_PROVIDER_SITE_OTHER): Payer: BC Managed Care – PPO | Admitting: Physician Assistant

## 2021-09-17 ENCOUNTER — Encounter: Payer: Self-pay | Admitting: Physician Assistant

## 2021-09-17 VITALS — BP 151/105 | HR 69 | Ht 73.0 in | Wt 222.0 lb

## 2021-09-17 DIAGNOSIS — I10 Essential (primary) hypertension: Secondary | ICD-10-CM

## 2021-09-17 DIAGNOSIS — E039 Hypothyroidism, unspecified: Secondary | ICD-10-CM | POA: Diagnosis not present

## 2021-09-17 NOTE — Progress Notes (Signed)
I,Sha'taria Tyson,acting as a Education administrator for [employer], PA-C.,have documented all relevant documentation on the behalf of Mikey Kirschner, PA-C,as directed by  Mikey Kirschner, PA-C while in the presence of Mikey Kirschner, PA-C.  Established patient visit   Patient: Matthew York   DOB: 26-Jan-1981   41 y.o. Male  MRN: [id number] Visit Date: 09/17/2021  Today's healthcare provider: Mikey Kirschner, PA-C   Cc. htn  Subjective    HPI  Hypertension, follow-up  BP Readings from Last 3 Encounters:  09/17/21 (!) 151/105  12/22/20 (!) 124/104  05/31/20 126/84   Wt Readings from Last 3 Encounters:  09/17/21 222 lb (100.7 kg)  12/22/20 213 lb (96.6 kg)  05/31/20 210 lb (95.3 kg)     He was last seen for hypertension 8 months ago.  BP at that visit was 124/104. Management since that visit includes continue current treatment.  He reports excellent compliance with treatment. He is not having side effects.  He is following a Regular diet. He is exercising. He does not smoke.  Use of agents associated with hypertension: none.   Outside blood pressures are checks on occasion Symptoms: No chest pain No chest pressure  No palpitations No syncope  No dyspnea No orthopnea  No paroxysmal nocturnal dyspnea No lower extremity edema   Pertinent labs Lab Results  Component Value Date   CHOL 125 11/04/2019   HDL 32 (L) 11/04/2019   LDLCALC 68 11/04/2019   TRIG 139 11/04/2019   CHOLHDL 3.9 11/04/2019   Lab Results  Component Value Date   NA 140 11/04/2019   K 4.1 11/04/2019   CREATININE 1.11 11/04/2019   GFRNONAA 82 11/04/2019   GLUCOSE 85 11/04/2019   TSH 2.500 11/04/2019     The ASCVD Risk score (Arnett DK, et al., 2019) failed to calculate for the following reasons:   The valid total cholesterol range is 130 to 320 mg/dL   --------------------------------------------------------------------------------------------------- Hypothyroid, follow-up  Lab Results  Component  Value Date   TSH 2.500 11/04/2019   TSH 4.030 07/30/2019   TSH 3.910 12/18/2018   T4TOTAL 7.1 07/30/2019    Wt Readings from Last 3 Encounters:  09/17/21 222 lb (100.7 kg)  12/22/20 213 lb (96.6 kg)  05/31/20 210 lb (95.3 kg)    He was last seen for hypothyroid 8 months ago.  Management since that visit includes no changes. He reports excellent compliance with treatment. He is not having side effects.   Symptoms: No change in energy level No constipation  No diarrhea No heat / cold intolerance  No nervousness No palpitations  No weight changes    -----------------------------------------------------------------------------------------   Medications: Outpatient Medications Prior to Visit  Medication Sig   amLODipine (NORVASC) 10 MG tablet TAKE 1 TABLET BY MOUTH EVERY DAY   atorvastatin (LIPITOR) 40 MG tablet TAKE 1 TABLET BY MOUTH EVERY DAY   metoprolol succinate (TOPROL-XL) 50 MG 24 hr tablet TAKE 1 TABLET BY MOUTH EVERY DAY WITH OR IMMEDIATELY FOLLOWING A MEAL   tadalafil (CIALIS) 5 MG tablet TAKE 1 TABLET BY MOUTH DAILY AS NEEDED FOR ERECTILE DYSFUNCTION.   levothyroxine (SYNTHROID) 88 MCG tablet TAKE 1 TABLET BY MOUTH EVERY DAY BEFORE BREAKFAST (Patient not taking: Reported on 09/17/2021)   [DISCONTINUED] meloxicam (MOBIC) 15 MG tablet Take 1 tablet (15 mg total) by mouth daily. (Patient not taking: Reported on 09/17/2021)   [DISCONTINUED] methylPREDNISolone (MEDROL DOSEPAK) 4 MG TBPK tablet 6 day dose pack - take as directed (Patient not taking: Reported on  09/17/2021)   [DISCONTINUED] tobramycin-dexamethasone (TOBRADEX) ophthalmic solution SMARTSIG:2 Drop(s) Left Eye Every 4 Hours (Patient not taking: Reported on 09/17/2021)   No facility-administered medications prior to visit.    Review of Systems  Constitutional:  Negative for fatigue and fever.  Respiratory:  Negative for cough and shortness of breath.   Cardiovascular:  Negative for chest pain, palpitations and leg  swelling.  Neurological:  Negative for dizziness and headaches.      Objective    Blood pressure (!) 151/105, pulse 69, height '6\' 1"'$  (1.854 m), weight 222 lb (100.7 kg), SpO2 98 %.   Physical Exam Constitutional:      General: He is awake.     Appearance: He is well-developed.  HENT:     Head: Normocephalic.  Eyes:     Conjunctiva/sclera: Conjunctivae normal.  Cardiovascular:     Rate and Rhythm: Normal rate and regular rhythm.     Heart sounds: Normal heart sounds.  Pulmonary:     Effort: Pulmonary effort is normal.     Breath sounds: Normal breath sounds.  Skin:    General: Skin is warm.  Neurological:     Mental Status: He is alert and oriented to person, place, and time.  Psychiatric:        Attention and Perception: Attention normal.        Mood and Affect: Mood normal.        Speech: Speech normal.        Behavior: Behavior is cooperative.     No results found for any visits on 09/17/21.  Assessment & Plan     Problem List Items Addressed This Visit       Cardiovascular and Mediastinum   Hypertension - Primary    Elevated in office. Pt does not check consistently at home. Advised checking daily x 2 weeks and return to office for more recommendations If still elevated at home, consider ref to cardio d/t polypham on htn meds already, at only 41. If pt not amenable to cardiologist, would add acei vs arb      Relevant Orders   Comprehensive Metabolic Panel (CMET)   CBC w/Diff/Platelet     Endocrine   Hypothyroidism    Previously stable on levothyroxine 88 mcg Will check tsh/t4 and refill      Relevant Orders   TSH + free T4     Return in about 2 weeks (around 10/01/2021) for hypertension.      I, Mikey Kirschner, PA-C have reviewed all documentation for this visit. The documentation on  09/17/2021 for the exam, diagnosis, procedures, and orders are all accurate and complete.Mikey Kirschner, PA-C [facility] [address]  [address] [place], [place], [address] Office: [phone] Fax: [place]

## 2021-09-17 NOTE — Assessment & Plan Note (Signed)
Elevated in office. Pt does not check consistently at home. Advised checking daily x 2 weeks and return to office for more recommendations If still elevated at home, consider ref to cardio d/t polypham on htn meds already, at only 41. If pt not amenable to cardiologist, would add acei vs arb

## 2021-09-17 NOTE — Assessment & Plan Note (Signed)
Previously stable on levothyroxine 88 mcg Will check tsh/t4 and refill

## 2021-09-18 LAB — CBC WITH DIFFERENTIAL/PLATELET
Basophils Absolute: 0 10*3/uL (ref 0.0–0.2)
Basos: 1 %
EOS (ABSOLUTE): 0.1 10*3/uL (ref 0.0–0.4)
Eos: 2 %
Hematocrit: 46.3 % (ref 37.5–51.0)
Hemoglobin: 14.9 g/dL (ref 13.0–17.7)
Immature Grans (Abs): 0 10*3/uL (ref 0.0–0.1)
Immature Granulocytes: 0 %
Lymphocytes Absolute: 2.3 10*3/uL (ref 0.7–3.1)
Lymphs: 40 %
MCH: 27.3 pg (ref 26.6–33.0)
MCHC: 32.2 g/dL (ref 31.5–35.7)
MCV: 85 fL (ref 79–97)
Monocytes Absolute: 0.4 10*3/uL (ref 0.1–0.9)
Monocytes: 6 %
Neutrophils Absolute: 2.9 10*3/uL (ref 1.4–7.0)
Neutrophils: 51 %
Platelets: 245 10*3/uL (ref 150–450)
RBC: 5.46 x10E6/uL (ref 4.14–5.80)
RDW: 13.1 % (ref 11.6–15.4)
WBC: 5.7 10*3/uL (ref 3.4–10.8)

## 2021-09-18 LAB — COMPREHENSIVE METABOLIC PANEL
ALT: 21 IU/L (ref 0–44)
AST: 13 IU/L (ref 0–40)
Albumin/Globulin Ratio: 2.1 (ref 1.2–2.2)
Albumin: 4.6 g/dL (ref 4.1–5.1)
Alkaline Phosphatase: 69 IU/L (ref 44–121)
BUN/Creatinine Ratio: 15 (ref 9–20)
BUN: 15 mg/dL (ref 6–24)
Bilirubin Total: 1.2 mg/dL (ref 0.0–1.2)
CO2: 24 mmol/L (ref 20–29)
Calcium: 9.6 mg/dL (ref 8.7–10.2)
Chloride: 104 mmol/L (ref 96–106)
Creatinine, Ser: 0.97 mg/dL (ref 0.76–1.27)
Globulin, Total: 2.2 g/dL (ref 1.5–4.5)
Glucose: 101 mg/dL — ABNORMAL HIGH (ref 70–99)
Potassium: 4.4 mmol/L (ref 3.5–5.2)
Sodium: 141 mmol/L (ref 134–144)
Total Protein: 6.8 g/dL (ref 6.0–8.5)
eGFR: 99 mL/min/{1.73_m2} (ref >59)

## 2021-09-18 LAB — TSH+FREE T4
Free T4: 1.25 ng/dL (ref 0.82–1.77)
TSH: 2.89 u[IU]/mL (ref 0.450–4.500)

## 2021-09-20 ENCOUNTER — Ambulatory Visit (INDEPENDENT_AMBULATORY_CARE_PROVIDER_SITE_OTHER): Payer: BC Managed Care – PPO

## 2021-09-20 DIAGNOSIS — Q666 Other congenital valgus deformities of feet: Secondary | ICD-10-CM

## 2021-09-20 NOTE — Progress Notes (Signed)
Patient presents today to pick up custom molded foot orthotics, diagnosed with pes planovalgus by Dr. Milinda Pointer.   Orthotics were dispensed and fit was satisfactory. Reviewed instructions for break-in and wear. Written instructions given to patient.  Patient will follow up as needed.   [facility] - order # [id number]

## 2021-09-23 ENCOUNTER — Other Ambulatory Visit: Payer: Self-pay | Admitting: Physician Assistant

## 2021-09-23 DIAGNOSIS — I10 Essential (primary) hypertension: Secondary | ICD-10-CM

## 2021-09-26 ENCOUNTER — Telehealth: Payer: Self-pay

## 2021-09-26 NOTE — Telephone Encounter (Signed)
Pt returned our call. Shared provider's note. Reviewed proper procedure for accurate BP measurement. No further questions.  Mikey Kirschner, PA-C  09/18/2021  8:34 AM EDT     Normal/stable labs

## 2021-10-04 ENCOUNTER — Ambulatory Visit: Payer: BC Managed Care – PPO | Admitting: Physician Assistant

## 2021-10-04 ENCOUNTER — Encounter: Payer: Self-pay | Admitting: Physician Assistant

## 2021-10-04 DIAGNOSIS — I10 Essential (primary) hypertension: Secondary | ICD-10-CM

## 2021-10-04 NOTE — Progress Notes (Signed)
I,Sha'taria Tyson,acting as a Education administrator for [employer], PA-C.,have documented all relevant documentation on the behalf of Mikey Kirschner, PA-C,as directed by  Mikey Kirschner, PA-C while in the presence of Mikey Kirschner, PA-C.   Established patient visit   Patient: Matthew York   DOB: [birth year]   41 y.o. Male  MRN: [id number] Visit Date: 10/04/2021  Today's healthcare provider: Mikey Kirschner, PA-C   Cc. Htn f/u  Subjective    HPI  Hypertension, follow-up  BP Readings from Last 3 Encounters:  10/04/21 120/83  09/17/21 (!) 151/105  12/22/20 (!) 124/104   Wt Readings from Last 3 Encounters:  10/04/21 219 lb 4.8 oz (99.5 kg)  09/17/21 222 lb (100.7 kg)  12/22/20 213 lb (96.6 kg)     He was last seen for hypertension 2 weeks ago.  BP at that visit was 151/105. Management since that visit includes checking daily x 2 weeks and return to office for more recommendations If still elevated at home, consider ref to cardio d/t polypham on htn meds already, at only 41. If pt not amenable to cardiologist, would add acei vs arb.  He reports excellent compliance with treatment. He is not having side effects.  He is following a Low Sodium diet. He is not exercising. He does not smoke.  Use of agents associated with hypertension:  fish oil .   Outside blood pressures are lowest 120/83-142/87 highest Symptoms: No chest pain No chest pressure  No palpitations No syncope  No dyspnea No orthopnea  No paroxysmal nocturnal dyspnea No lower extremity edema   Pertinent labs Lab Results  Component Value Date   CHOL 125 11/04/2019   HDL 32 (L) 11/04/2019   LDLCALC 68 11/04/2019   TRIG 139 11/04/2019   CHOLHDL 3.9 11/04/2019   Lab Results  Component Value Date   NA 141 09/17/2021   K 4.4 09/17/2021   CREATININE 0.97 09/17/2021   EGFR 99 09/17/2021   GLUCOSE 101 (H) 09/17/2021   TSH 2.890 09/17/2021     The ASCVD Risk score (Arnett DK, et al., 2019) failed to calculate for the  following reasons:   The valid total cholesterol range is 130 to 320 mg/dL  ---------------------------------------------------------------------------------------------------   Medications: Outpatient Medications Prior to Visit  Medication Sig   amLODipine (NORVASC) 10 MG tablet TAKE 1 TABLET BY MOUTH EVERY DAY   atorvastatin (LIPITOR) 40 MG tablet TAKE 1 TABLET BY MOUTH EVERY DAY   levothyroxine (SYNTHROID) 88 MCG tablet TAKE 1 TABLET BY MOUTH EVERY DAY BEFORE BREAKFAST   metoprolol succinate (TOPROL-XL) 50 MG 24 hr tablet TAKE 1 TABLET BY MOUTH EVERY DAY WITH OR IMMEDIATELY FOLLOWING A MEAL   tadalafil (CIALIS) 5 MG tablet TAKE 1 TABLET BY MOUTH DAILY AS NEEDED FOR ERECTILE DYSFUNCTION.   No facility-administered medications prior to visit.    Review of Systems  Constitutional:  Negative for fatigue and fever.  Respiratory:  Negative for cough and shortness of breath.   Cardiovascular:  Negative for chest pain, palpitations and leg swelling.  Neurological:  Negative for dizziness and headaches.      Objective    Blood pressure 120/83, pulse 85, height 6' 1"  (1.854 m), weight 219 lb 4.8 oz (99.5 kg), SpO2 99 %.   Physical Exam Constitutional:      General: He is awake.     Appearance: He is well-developed.  HENT:     Head: Normocephalic.  Eyes:     Conjunctiva/sclera: Conjunctivae normal.  Cardiovascular:  Rate and Rhythm: Normal rate and regular rhythm.     Heart sounds: Normal heart sounds.  Pulmonary:     Effort: Pulmonary effort is normal.     Breath sounds: Normal breath sounds.  Skin:    General: Skin is warm.  Neurological:     Mental Status: He is alert and oriented to person, place, and time.  Psychiatric:        Attention and Perception: Attention normal.        Mood and Affect: Mood normal.        Speech: Speech normal.        Behavior: Behavior is cooperative.     No results found for any visits on 10/04/21.  Assessment & Plan     Problem  List Items Addressed This Visit       Cardiovascular and Mediastinum   Hypertension    Currently managing with amlodipine 10 mg and metoprolol 50 mg daily  Very well controlled at home.  F/u 4 mo         Return in about 4 months (around 02/03/2022) for CPE.      I, Mikey Kirschner, PA-C have reviewed all documentation for this visit. The documentation on  10/04/2021 for the exam, diagnosis, procedures, and orders are all accurate and complete.  Mikey Kirschner, PA-C [facility] [address] [place], [place], [address] Office: [phone] Fax: [place]

## 2021-10-04 NOTE — Assessment & Plan Note (Addendum)
Currently managing with amlodipine 10 mg and metoprolol 50 mg daily  Very well controlled at home.  F/u 4 mo

## 2021-12-05 ENCOUNTER — Telehealth: Payer: Self-pay | Admitting: Physician Assistant

## 2021-12-05 DIAGNOSIS — N529 Male erectile dysfunction, unspecified: Secondary | ICD-10-CM

## 2021-12-05 MED ORDER — TADALAFIL 5 MG PO TABS: ORAL_TABLET | ORAL | 1 refills | Status: DC

## 2021-12-05 NOTE — Telephone Encounter (Signed)
[facility] faxed refill request for the following medications:  tadalafil (CIALIS) 5 MG tablet    Please advise.

## 2021-12-05 NOTE — Telephone Encounter (Signed)
#  30 1RF sent

## 2022-01-02 ENCOUNTER — Ambulatory Visit: Payer: BC Managed Care – PPO | Admitting: Dermatology

## 2022-01-02 DIAGNOSIS — D229 Melanocytic nevi, unspecified: Secondary | ICD-10-CM

## 2022-01-02 DIAGNOSIS — D224 Melanocytic nevi of scalp and neck: Secondary | ICD-10-CM | POA: Diagnosis not present

## 2022-01-02 NOTE — Progress Notes (Signed)
   New Patient Visit  Subjective  Matthew York is a 41 y.o. male who presents for the following: Skin Problem. Patient c/o growth on the right scalp for several months.   The following portions of the chart were reviewed this encounter and updated as appropriate:   Tobacco  Allergies  Meds  Problems  Med Hx  Surg Hx  Fam Hx     Review of Systems:  No other skin or systemic complaints except as noted in HPI or Assessment and Plan.  Objective  Well appearing patient in no apparent distress; mood and affect are within normal limits.  A focused examination was performed including scalp. Relevant physical exam findings are noted in the Assessment and Plan.  left post scalp at post hairline 0.3 light brown papule         Assessment & Plan  Nevus left post scalp at post hairline  0.3 regular light brown papule See photo - Benign appearing on exam today - Observation - Call clinic for new or changing moles - Recommend daily use of broad spectrum spf 30+ sunscreen to sun-exposed areas.    Return if symptoms worsen or fail to improve.  IMarye Round, CMA, am acting as scribe for Sarina Ser, MD .  Documentation: I have reviewed the above documentation for accuracy and completeness, and I agree with the above.  Sarina Ser, MD

## 2022-01-02 NOTE — Patient Instructions (Signed)
Due to recent changes in healthcare laws, you may see results of your pathology and/or laboratory studies on MyChart before the doctors have had a chance to review them. We understand that in some cases there may be results that are confusing or concerning to you. Please understand that not all results are received at the same time and often the doctors may need to interpret multiple results in order to provide you with the best plan of care or course of treatment. Therefore, we ask that you please give us 2 business days to thoroughly review all your results before contacting the office for clarification. Should we see a critical lab result, you will be contacted sooner.   If You Need Anything After Your Visit  If you have any questions or concerns for your doctor, please call our main line at [phone] and press option 4 to reach your doctor's medical assistant. If no one answers, please leave a voicemail as directed and we will return your call as soon as possible. Messages left after 4 pm will be answered the following business day.   You may also send us a message via MyChart. We typically respond to MyChart messages within 1-2 business days.  For prescription refills, please ask your pharmacy to contact our office. Our fax number is [phone].  If you have an urgent issue when the clinic is closed that cannot wait until the next business day, you can page your doctor at the number below.    Please note that while we do our best to be available for urgent issues outside of office hours, we are not available 24/7.   If you have an urgent issue and are unable to reach us, you may choose to seek medical care at your doctor's office, retail clinic, urgent care center, or emergency room.  If you have a medical emergency, please immediately call 911 or go to the emergency department.  Pager Numbers  - Dr. Kowalski: [phone]  - Dr. Moye: [phone]  - Dr. Stewart:  [phone]  In the event of inclement weather, please call our main line at [phone] for an update on the status of any delays or closures.  Dermatology Medication Tips: Please keep the boxes that topical medications come in in order to help keep track of the instructions about where and how to use these. Pharmacies typically print the medication instructions only on the boxes and not directly on the medication tubes.   If your medication is too expensive, please contact our office at [phone] option 4 or send us a message through MyChart.   We are unable to tell what your co-pay for medications will be in advance as this is different depending on your insurance coverage. However, we may be able to find a substitute medication at lower cost or fill out paperwork to get insurance to cover a needed medication.   If a prior authorization is required to get your medication covered by your insurance company, please allow us 1-2 business days to complete this process.  Drug prices often vary depending on where the prescription is filled and some pharmacies may offer cheaper prices.  The website www.goodrx.com contains coupons for medications through different pharmacies. The prices here do not account for what the cost may be with help from insurance (it may be cheaper with your insurance), but the website can give you the price if you did not use any insurance.  - You can print the associated coupon and take it with   your prescription to the pharmacy.  - You may also stop by our office during regular business hours and pick up a GoodRx coupon card.  - If you need your prescription sent electronically to a different pharmacy, notify our office through [facility] MyChart or by phone at [phone] option 4.     Si Usted Necesita Algo Despus de Su Visita  Tambin puede enviarnos un mensaje a travs de MyChart. Por lo general respondemos a los mensajes de MyChart en el transcurso de 1 a 2  das hbiles.  Para renovar recetas, por favor pida a su farmacia que se ponga en contacto con nuestra oficina. Nuestro nmero de fax es el [phone].  Si tiene un asunto urgente cuando la clnica est cerrada y que no puede esperar hasta el siguiente da hbil, puede llamar/localizar a su doctor(a) al nmero que aparece a continuacin.   Por favor, tenga en cuenta que aunque hacemos todo lo posible para estar disponibles para asuntos urgentes fuera del horario de oficina, no estamos disponibles las 24 horas del da, los 7 das de la semana.   Si tiene un problema urgente y no puede comunicarse con nosotros, puede optar por buscar atencin mdica  en el consultorio de su doctor(a), en una clnica privada, en un centro de atencin urgente o en una sala de emergencias.  Si tiene una emergencia mdica, por favor llame inmediatamente al 911 o vaya a la sala de emergencias.  Nmeros de bper  - Dr. Kowalski: [phone]  - Dra. Moye: [phone]  - Dra. Stewart: [phone]  En caso de inclemencias del tiempo, por favor llame a nuestra lnea principal al [phone] para una actualizacin sobre el estado de cualquier retraso o cierre.  Consejos para la medicacin en dermatologa: Por favor, guarde las cajas en las que vienen los medicamentos de uso tpico para ayudarle a seguir las instrucciones sobre dnde y cmo usarlos. Las farmacias generalmente imprimen las instrucciones del medicamento slo en las cajas y no directamente en los tubos del medicamento.   Si su medicamento es muy caro, por favor, pngase en contacto con nuestra oficina llamando al [phone] y presione la opcin 4 o envenos un mensaje a travs de MyChart.   No podemos decirle cul ser su copago por los medicamentos por adelantado ya que esto es diferente dependiendo de la cobertura de su seguro. Sin embargo, es posible que podamos encontrar un medicamento sustituto a menor costo o llenar un formulario para que el  seguro cubra el medicamento que se considera necesario.   Si se requiere una autorizacin previa para que su compaa de seguros cubra su medicamento, por favor permtanos de 1 a 2 das hbiles para completar este proceso.  Los precios de los medicamentos varan con frecuencia dependiendo del lugar de dnde se surte la receta y alguna farmacias pueden ofrecer precios ms baratos.  El sitio web www.goodrx.com tiene cupones para medicamentos de diferentes farmacias. Los precios aqu no tienen en cuenta lo que podra costar con la ayuda del seguro (puede ser ms barato con su seguro), pero el sitio web puede darle el precio si no utiliz ningn seguro.  - Puede imprimir el cupn correspondiente y llevarlo con su receta a la farmacia.  - Tambin puede pasar por nuestra oficina durante el horario de atencin regular y recoger una tarjeta de cupones de GoodRx.  - Si necesita que su receta se enve electrnicamente a una farmacia diferente, informe a nuestra oficina a travs de MyChart de [place]   o por telfono llamando al [phone] y presione la opcin 4.  

## 2022-01-16 ENCOUNTER — Encounter: Payer: Self-pay | Admitting: Dermatology

## 2022-01-23 DIAGNOSIS — M7662 Achilles tendinitis, left leg: Secondary | ICD-10-CM | POA: Diagnosis not present

## 2022-01-23 DIAGNOSIS — M2560 Stiffness of unspecified joint, not elsewhere classified: Secondary | ICD-10-CM | POA: Diagnosis not present

## 2022-01-23 DIAGNOSIS — M7661 Achilles tendinitis, right leg: Secondary | ICD-10-CM | POA: Diagnosis not present

## 2022-01-23 DIAGNOSIS — M216X9 Other acquired deformities of unspecified foot: Secondary | ICD-10-CM | POA: Diagnosis not present

## 2022-02-01 ENCOUNTER — Other Ambulatory Visit: Payer: Self-pay | Admitting: Physician Assistant

## 2022-02-01 DIAGNOSIS — N529 Male erectile dysfunction, unspecified: Secondary | ICD-10-CM

## 2022-02-01 NOTE — Telephone Encounter (Signed)
Requested Prescriptions  Pending Prescriptions Disp Refills   tadalafil (CIALIS) 5 MG tablet [Pharmacy Med Name: TADALAFIL 5 MG TABLET] 30 tablet 0    Sig: TAKE 1 TABLET BY MOUTH DAILY AS NEEDED FOR ERECTILE DYSFUNCTION.     Urology: Erectile Dysfunction Agents Passed - 02/01/2022 12:40 AM      Passed - AST in normal range and within 360 days    AST  Date Value Ref Range Status  09/17/2021 13 0 - 40 IU/L Final         Passed - ALT in normal range and within 360 days    ALT  Date Value Ref Range Status  09/17/2021 21 0 - 44 IU/L Final         Passed - Last BP in normal range    BP Readings from Last 1 Encounters:  10/04/21 120/83         Passed - Valid encounter within last 12 months    Recent Outpatient Visits           4 months ago Primary hypertension   [facility] Thedore Mins, [place], PA-C   4 months ago Primary hypertension   [facility] Mikey Kirschner, PA-C   1 year ago Primary hypertension   [facility] Birdie Sons, MD   1 year ago Primary hypertension   [facility], Laurita Quint, FNP   1 year ago Primary hypertension   [facility], Vickki Muff, PA-C       Future Appointments             In 6 days Drubel, Ria Comment, PA-C [employer], [place]

## 2022-02-06 NOTE — Progress Notes (Signed)
I,Sha'taria Tyson,acting as a Education administrator for [employer], PA-C.,have documented all relevant documentation on the behalf of Mikey Kirschner, PA-C,as directed by  Mikey Kirschner, PA-C while in the presence of Mikey Kirschner, PA-C.   Complete physical exam   Patient: Matthew York   DOB: 09/09/1980   41 y.o. Male  MRN: [id number] Visit Date: 02/07/2022  Today's healthcare provider: Mikey Kirschner, PA-C   No chief complaint on file.  Subjective    Matthew York is a 41 y.o. male who presents today for a complete physical exam.  He reports consuming a {diet types:17450} diet. {Exercise:19826} He generally feels {well/fairly well/poorly:18703}. He reports sleeping {well/fairly well/poorly:18703}. He {does/does not:200015} have additional problems to discuss today.  HPI  ***  Past Medical History:  Diagnosis Date   Elevated blood pressure    Past Surgical History:  Procedure Laterality Date   VASECTOMY  2014   Social History   Socioeconomic History   Marital status: Married    Spouse name: Not on file   Number of children: Not on file   Years of education: Not on file   Highest education level: Not on file  Occupational History   Not on file  Tobacco Use   Smoking status: Never   Smokeless tobacco: Never  Substance and Sexual Activity   Alcohol use: Yes    Comment: Occasional use   Drug use: No   Sexual activity: Not on file  Other Topics Concern   Not on file  Social History Narrative   Not on file   Social Determinants of Health   Financial Resource Strain: Not on file  Food Insecurity: Not on file  Transportation Needs: Not on file  Physical Activity: Not on file  Stress: Not on file  Social Connections: Not on file  Intimate Partner Violence: Not on file   Family Status  Relation Name Status   Mother  Alive   Father  Alive   Family History  Problem Relation Age of Onset   Hypertension Father    No Known Allergies  Patient Care Team: Mikey Kirschner,  PA-C as PCP - General (Physician Assistant)   Medications: Outpatient Medications Prior to Visit  Medication Sig   amLODipine (NORVASC) 10 MG tablet TAKE 1 TABLET BY MOUTH EVERY DAY   atorvastatin (LIPITOR) 40 MG tablet TAKE 1 TABLET BY MOUTH EVERY DAY   levothyroxine (SYNTHROID) 88 MCG tablet TAKE 1 TABLET BY MOUTH EVERY DAY BEFORE BREAKFAST   metoprolol succinate (TOPROL-XL) 50 MG 24 hr tablet TAKE 1 TABLET BY MOUTH EVERY DAY WITH OR IMMEDIATELY FOLLOWING A MEAL   tadalafil (CIALIS) 5 MG tablet TAKE 1 TABLET BY MOUTH DAILY AS NEEDED FOR ERECTILE DYSFUNCTION.   No facility-administered medications prior to visit.    Review of Systems  {Labs  Heme  Chem  Endocrine  Serology  Results Review (optional):23779}  Objective    There were no vitals taken for this visit. {Show previous vital signs (optional):23777}   Physical Exam  ***  Last depression screening scores    01/17/2020    1:18 PM 11/04/2019    8:21 AM 12/15/2018    1:26 PM  PHQ 2/9 Scores  PHQ - 2 Score 0 0 0   Last fall risk screening    01/17/2020    1:18 PM  Why in the past year? 0  Number falls in past yr: 0  Injury with Fall? 0   Last Audit-C alcohol use screening  01/17/2020    1:18 PM  Alcohol Use Disorder Test (AUDIT)  1. How often do you have a drink containing alcohol? 1  2. How many drinks containing alcohol do you have on a typical day when you are drinking? 0  3. How often do you have six or more drinks on one occasion? 0  AUDIT-C Score 1  Alcohol Brief Interventions/Follow-up AUDIT Score <7 follow-up not indicated   A score of 3 or more in women, and 4 or more in men indicates increased risk for alcohol abuse, EXCEPT if all of the points are from question 1   No results found for any visits on 02/07/22.  Assessment & Plan    Routine Health Maintenance and Physical Exam  Exercise Activities and Dietary recommendations  Goals   None     Immunization History   Administered Date(s) Administered   Influenza,inj,Quad PF,6+ Mos 11/03/2012, 12/23/2014, 12/15/2018, 12/22/2020   PFIZER(Purple Top)SARS-COV-2 Vaccination 08/10/2019, 08/31/2019   Tdap 04/22/2019    Health Maintenance  Topic Date Due   INFLUENZA VACCINE  09/18/2021   COVID-19 Vaccine (3 - 2023-24 season) 10/19/2021   Hepatitis C Screening  09/18/2022 (Originally 09/02/1996)   HIV Screening  09/18/2022 (Originally 09/02/1993)   DTaP/Tdap/Td (2 - Td or Tdap) 04/21/2029   HPV VACCINES  Aged Out    Discussed health benefits of physical activity, and encouraged him to engage in regular exercise appropriate for his age and condition.  ***  No follow-ups on file.     {provider attestation***:1}   Mikey Kirschner, PA-C  [facility] [phone] (phone) [phone] (fax)  [place]

## 2022-02-07 ENCOUNTER — Encounter: Payer: Self-pay | Admitting: Physician Assistant

## 2022-02-07 ENCOUNTER — Ambulatory Visit (INDEPENDENT_AMBULATORY_CARE_PROVIDER_SITE_OTHER): Payer: BC Managed Care – PPO | Admitting: Physician Assistant

## 2022-02-07 VITALS — BP 120/80 | HR 80 | Ht 73.0 in | Wt 225.1 lb

## 2022-02-07 DIAGNOSIS — Z23 Encounter for immunization: Secondary | ICD-10-CM | POA: Diagnosis not present

## 2022-02-07 DIAGNOSIS — E039 Hypothyroidism, unspecified: Secondary | ICD-10-CM | POA: Diagnosis not present

## 2022-02-07 DIAGNOSIS — E782 Mixed hyperlipidemia: Secondary | ICD-10-CM

## 2022-02-07 DIAGNOSIS — Z0000 Encounter for general adult medical examination without abnormal findings: Secondary | ICD-10-CM | POA: Diagnosis not present

## 2022-02-07 DIAGNOSIS — I10 Essential (primary) hypertension: Secondary | ICD-10-CM

## 2022-02-07 MED ORDER — LEVOTHYROXINE SODIUM 88 MCG PO TABS: ORAL_TABLET | ORAL | 3 refills | Status: DC

## 2022-02-07 NOTE — Assessment & Plan Note (Signed)
Managed with lipitor 40 mg Ordered fasting lipids

## 2022-02-07 NOTE — Assessment & Plan Note (Signed)
Elevated in office today. Advised pt to continue to monitor at home, if > 130 / > 90 please call/message office for further recommendations Ordered cmp Continue medications F.u 6 mo

## 2022-02-07 NOTE — Assessment & Plan Note (Signed)
Previously stable, reviewed labs Refilled levothyroxine

## 2022-02-08 LAB — COMPREHENSIVE METABOLIC PANEL
ALT: 27 IU/L (ref 0–44)
AST: 19 IU/L (ref 0–40)
Albumin/Globulin Ratio: 1.8 (ref 1.2–2.2)
Albumin: 4.5 g/dL (ref 4.1–5.1)
Alkaline Phosphatase: 67 IU/L (ref 44–121)
BUN/Creatinine Ratio: 18 (ref 9–20)
BUN: 18 mg/dL (ref 6–24)
Bilirubin Total: 1.1 mg/dL (ref 0.0–1.2)
CO2: 22 mmol/L (ref 20–29)
Calcium: 9.5 mg/dL (ref 8.7–10.2)
Chloride: 103 mmol/L (ref 96–106)
Creatinine, Ser: 1 mg/dL (ref 0.76–1.27)
Globulin, Total: 2.5 g/dL (ref 1.5–4.5)
Glucose: 91 mg/dL (ref 70–99)
Potassium: 4.4 mmol/L (ref 3.5–5.2)
Sodium: 140 mmol/L (ref 134–144)
Total Protein: 7 g/dL (ref 6.0–8.5)
eGFR: 96 mL/min/{1.73_m2} (ref >59)

## 2022-02-08 LAB — LIPID PANEL
Chol/HDL Ratio: 3.9 ratio (ref 0.0–5.0)
Cholesterol, Total: 135 mg/dL (ref 100–199)
HDL: 35 mg/dL — ABNORMAL LOW (ref >39)
LDL Chol Calc (NIH): 78 mg/dL (ref 0–99)
Triglycerides: 124 mg/dL (ref 0–149)
VLDL Cholesterol Cal: 22 mg/dL (ref 5–40)

## 2022-03-06 ENCOUNTER — Other Ambulatory Visit: Payer: Self-pay | Admitting: Physician Assistant

## 2022-03-06 DIAGNOSIS — N529 Male erectile dysfunction, unspecified: Secondary | ICD-10-CM

## 2022-03-19 NOTE — Progress Notes (Unsigned)
      Established patient visit   Patient: Matthew York   DOB: Apr 16, 1980   42 y.o. Male  MRN: [id number] Visit Date: 03/20/2022  Today's healthcare provider: Mikey Kirschner, PA-C   No chief complaint on file.  Subjective    HPI  Patient is being seen due to spot located on right shin below knee  Medications: Outpatient Medications Prior to Visit  Medication Sig   amLODipine (NORVASC) 10 MG tablet TAKE 1 TABLET BY MOUTH EVERY DAY   atorvastatin (LIPITOR) 40 MG tablet TAKE 1 TABLET BY MOUTH EVERY DAY   levothyroxine (SYNTHROID) 88 MCG tablet TAKE 1 TABLET BY MOUTH EVERY DAY BEFORE BREAKFAST   metoprolol succinate (TOPROL-XL) 50 MG 24 hr tablet TAKE 1 TABLET BY MOUTH EVERY DAY WITH OR IMMEDIATELY FOLLOWING A MEAL   tadalafil (CIALIS) 5 MG tablet TAKE 1 TABLET BY MOUTH DAILY AS NEEDED FOR ERECTILE DYSFUNCTION.   No facility-administered medications prior to visit.    Review of Systems  {Labs  Heme  Chem  Endocrine  Serology  Results Review (optional):23779}   Objective    There were no vitals taken for this visit. {Show previous vital signs (optional):23777}  Physical Exam  ***  No results found for any visits on 03/20/22.  Assessment & Plan     ***  No follow-ups on file.      {provider attestation***:1}   Mikey Kirschner, PA-C  [place] [phone] (phone) [phone] (fax)  [place]

## 2022-03-20 ENCOUNTER — Encounter: Payer: Self-pay | Admitting: Physician Assistant

## 2022-03-20 ENCOUNTER — Ambulatory Visit: Payer: BC Managed Care – PPO | Admitting: Physician Assistant

## 2022-03-20 VITALS — BP 128/87 | HR 74 | Temp 98.6°F | Ht 73.0 in | Wt 227.1 lb

## 2022-03-20 DIAGNOSIS — L989 Disorder of the skin and subcutaneous tissue, unspecified: Secondary | ICD-10-CM

## 2022-03-20 NOTE — Patient Instructions (Addendum)
[facility] Dermatology   [facility] Location Address  ?[address] [place], [place] [address]  Phone  [phone]

## 2022-03-29 ENCOUNTER — Other Ambulatory Visit: Payer: Self-pay | Admitting: Physician Assistant

## 2022-03-29 DIAGNOSIS — I10 Essential (primary) hypertension: Secondary | ICD-10-CM

## 2022-04-01 DIAGNOSIS — M85679 Other cyst of bone, unspecified ankle and foot: Secondary | ICD-10-CM | POA: Diagnosis not present

## 2022-04-01 DIAGNOSIS — M24572 Contracture, left ankle: Secondary | ICD-10-CM | POA: Diagnosis not present

## 2022-04-02 ENCOUNTER — Other Ambulatory Visit: Payer: Self-pay | Admitting: Physician Assistant

## 2022-04-02 DIAGNOSIS — I10 Essential (primary) hypertension: Secondary | ICD-10-CM

## 2022-04-07 ENCOUNTER — Other Ambulatory Visit: Payer: Self-pay | Admitting: Physician Assistant

## 2022-04-07 DIAGNOSIS — N529 Male erectile dysfunction, unspecified: Secondary | ICD-10-CM

## 2022-05-02 DIAGNOSIS — D2371 Other benign neoplasm of skin of right lower limb, including hip: Secondary | ICD-10-CM | POA: Diagnosis not present

## 2022-05-02 DIAGNOSIS — D229 Melanocytic nevi, unspecified: Secondary | ICD-10-CM | POA: Diagnosis not present

## 2022-05-17 ENCOUNTER — Other Ambulatory Visit: Payer: Self-pay | Admitting: Physician Assistant

## 2022-05-17 DIAGNOSIS — N529 Male erectile dysfunction, unspecified: Secondary | ICD-10-CM

## 2022-06-27 ENCOUNTER — Other Ambulatory Visit: Payer: Self-pay | Admitting: Physician Assistant

## 2022-06-27 DIAGNOSIS — E782 Mixed hyperlipidemia: Secondary | ICD-10-CM

## 2022-09-03 ENCOUNTER — Other Ambulatory Visit: Payer: Self-pay | Admitting: Physician Assistant

## 2022-09-03 DIAGNOSIS — E039 Hypothyroidism, unspecified: Secondary | ICD-10-CM

## 2022-09-17 ENCOUNTER — Emergency Department: Payer: BC Managed Care – PPO

## 2022-09-17 ENCOUNTER — Other Ambulatory Visit: Payer: Self-pay

## 2022-09-17 ENCOUNTER — Emergency Department
Admission: EM | Admit: 2022-09-17 | Discharge: 2022-09-17 | Disposition: A | Discharge status: Home / Self Care | Payer: BC Managed Care – PPO | Attending: Emergency Medicine | Admitting: Emergency Medicine

## 2022-09-17 ENCOUNTER — Encounter: Payer: Self-pay | Admitting: Emergency Medicine

## 2022-09-17 DIAGNOSIS — R109 Unspecified abdominal pain: Secondary | ICD-10-CM | POA: Diagnosis not present

## 2022-09-17 DIAGNOSIS — I2510 Atherosclerotic heart disease of native coronary artery without angina pectoris: Secondary | ICD-10-CM | POA: Diagnosis not present

## 2022-09-17 DIAGNOSIS — E039 Hypothyroidism, unspecified: Secondary | ICD-10-CM | POA: Diagnosis not present

## 2022-09-17 DIAGNOSIS — I10 Essential (primary) hypertension: Secondary | ICD-10-CM | POA: Diagnosis not present

## 2022-09-17 DIAGNOSIS — M5127 Other intervertebral disc displacement, lumbosacral region: Secondary | ICD-10-CM | POA: Diagnosis not present

## 2022-09-17 DIAGNOSIS — M545 Low back pain, unspecified: Secondary | ICD-10-CM | POA: Diagnosis not present

## 2022-09-17 DIAGNOSIS — M5459 Other low back pain: Secondary | ICD-10-CM | POA: Diagnosis not present

## 2022-09-17 DIAGNOSIS — M5126 Other intervertebral disc displacement, lumbar region: Secondary | ICD-10-CM | POA: Diagnosis not present

## 2022-09-17 DIAGNOSIS — K449 Diaphragmatic hernia without obstruction or gangrene: Secondary | ICD-10-CM | POA: Diagnosis not present

## 2022-09-17 DIAGNOSIS — K429 Umbilical hernia without obstruction or gangrene: Secondary | ICD-10-CM | POA: Diagnosis not present

## 2022-09-17 LAB — CBC WITH DIFFERENTIAL/PLATELET
Abs Immature Granulocytes: 0.02 10*3/uL (ref 0.00–0.07)
Basophils Absolute: 0 10*3/uL (ref 0.0–0.1)
Basophils Relative: 0 %
Eosinophils Absolute: 0.1 10*3/uL (ref 0.0–0.5)
Eosinophils Relative: 1 %
HCT: 47.8 % (ref 39.0–52.0)
Hemoglobin: 16.4 g/dL (ref 13.0–17.0)
Immature Granulocytes: 0 %
Lymphocytes Relative: 25 %
Lymphs Abs: 1.7 10*3/uL (ref 0.7–4.0)
MCH: 27.8 pg (ref 26.0–34.0)
MCHC: 34.3 g/dL (ref 30.0–36.0)
MCV: 81.2 fL (ref 80.0–100.0)
Monocytes Absolute: 0.2 10*3/uL (ref 0.1–1.0)
Monocytes Relative: 3 %
Neutro Abs: 4.8 10*3/uL (ref 1.7–7.7)
Neutrophils Relative %: 71 %
Platelets: 240 10*3/uL (ref 150–400)
RBC: 5.89 MIL/uL — ABNORMAL HIGH (ref 4.22–5.81)
RDW: 12.2 % (ref 11.5–15.5)
WBC: 6.8 10*3/uL (ref 4.0–10.5)
nRBC: 0 % (ref 0.0–0.2)

## 2022-09-17 LAB — URINALYSIS, W/ REFLEX TO CULTURE (INFECTION SUSPECTED)
Bilirubin Urine: NEGATIVE
Glucose, UA: 50 mg/dL — AB
Hgb urine dipstick: NEGATIVE
Ketones, ur: NEGATIVE mg/dL
Leukocytes,Ua: NEGATIVE
Nitrite: NEGATIVE
Protein, ur: NEGATIVE mg/dL
Specific Gravity, Urine: 1.011 (ref 1.005–1.030)
Squamous Epithelial / HPF: NONE SEEN /HPF (ref 0–5)
pH: 5 (ref 5.0–8.0)

## 2022-09-17 LAB — BASIC METABOLIC PANEL
Anion gap: 9 (ref 5–15)
BUN: 15 mg/dL (ref 6–20)
CO2: 23 mmol/L (ref 22–32)
Calcium: 9.2 mg/dL (ref 8.9–10.3)
Chloride: 104 mmol/L (ref 98–111)
Creatinine, Ser: 0.91 mg/dL (ref 0.61–1.24)
GFR, Estimated: >60 mL/min (ref >60)
Glucose, Bld: 206 mg/dL — ABNORMAL HIGH (ref 70–99)
Potassium: 3.8 mmol/L (ref 3.5–5.1)
Sodium: 136 mmol/L (ref 135–145)

## 2022-09-17 MED ORDER — PREDNISONE 10 MG (21) PO TBPK: ORAL_TABLET | ORAL | 0 refills | Status: DC

## 2022-09-17 MED ORDER — NAPROXEN 500 MG PO TABS: 500.0000 mg | ORAL_TABLET | Freq: Two times a day (BID) | ORAL | 0 refills | Status: AC

## 2022-09-17 MED ORDER — IOHEXOL 300 MG/ML  SOLN
100.0000 mL | Freq: Once | INTRAMUSCULAR | Status: AC | PRN
  Administered 2022-09-17: 100 mL via INTRAVENOUS

## 2022-09-17 MED ORDER — ONDANSETRON HCL 4 MG/2ML IJ SOLN
4.0000 mg | Freq: Once | INTRAMUSCULAR | Status: AC
  Administered 2022-09-17: 4 mg via INTRAVENOUS
  Filled 2022-09-17: qty 2

## 2022-09-17 MED ORDER — KETOROLAC TROMETHAMINE 15 MG/ML IJ SOLN
15.0000 mg | Freq: Once | INTRAMUSCULAR | Status: AC
  Administered 2022-09-17: 15 mg via INTRAVENOUS
  Filled 2022-09-17: qty 1

## 2022-09-17 MED ORDER — SODIUM CHLORIDE 0.9 % IV BOLUS
1000.0000 mL | Freq: Once | INTRAVENOUS | Status: AC
  Administered 2022-09-17: 1000 mL via INTRAVENOUS

## 2022-09-17 NOTE — ED Provider Notes (Signed)
[facility] Provider Note    Event Date/Time   First MD Initiated Contact with Patient 09/17/22 [phone]     (approximate)   History   Flank Pain   HPI  Matthew York is a 42 y.o. male with a past medical history of hypertension, hyperlipidemia, who presents today for evaluation of right-sided flank pain and lumbar pain that began yesterday.  He reports that the pain is not radiating.  He also reports that he has nausea this morning but has not had any vomiting.  He has not had a fever.  He denies radiation of pain to his groin or abdomen.  He has not noticed any urinary symptoms.  He denies any radiation down his legs.  No urinary fecal incontinence or retention.  No saddle anesthesia.  He has not had any difficulty walking.  No history of kidney stones.  No hematuria.  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          Physical Exam   Triage Vital Signs: ED Triage Vitals  Encounter Vitals Group     BP 09/17/22 0851 (!) 149/98     Systolic BP Percentile --      Diastolic BP Percentile --      Pulse Rate 09/17/22 0851 88     Resp 09/17/22 0851 16     Temp 09/17/22 0851 98.3 F (36.8 C)     Temp src --      SpO2 09/17/22 0851 98 %     Weight 09/17/22 0851 230 lb (104.3 kg)     Height 09/17/22 0851 6\' 1"  (1.854 m)     Head Circumference --      Peak Flow --      Pain Score 09/17/22 0851 4     Pain Loc --      Pain Education --      Exclude from Growth Chart --     Most recent vital signs: Vitals:   09/17/22 0851  BP: (!) 149/98  Pulse: 88  Resp: 16  Temp: 98.3 F (36.8 C)  SpO2: 98%    Physical Exam Vitals and nursing note reviewed.  Constitutional:       General: Awake and alert. No acute distress.    Appearance: Normal appearance. The patient is normal weight.  HENT:     Head: Normocephalic and atraumatic.     Mouth: Mucous membranes are moist.  Eyes:     General: PERRL. Normal EOMs        Right eye: No discharge.        Left eye: No discharge.     Conjunctiva/sclera: Conjunctivae normal.  Cardiovascular:     Rate and Rhythm: Normal rate and regular rhythm.     Pulses: Normal pulses.  Pulmonary:     Effort: Pulmonary effort is normal. No respiratory distress.     Breath sounds: Normal breath sounds.  Abdominal:     Abdomen is soft. There is no abdominal tenderness. No rebound or guarding. No distention.  No CVA tenderness Musculoskeletal:        General: No swelling. Normal range of motion.     Cervical back: Normal range of motion and neck supple.  Back: No  midline tenderness.  Pinpoint tenderness to the lumbar spine just to the right of the midline.  No overlying skin changes.  Strength and sensation 5/5 to bilateral lower extremities. Normal great toe extension against resistance. Normal sensation throughout feet. Normal patellar reflexes. Negative SLR and opposite SLR bilaterally.  Skin:    General: Skin is warm and dry.     Capillary Refill: Capillary refill takes less than 2 seconds.     Findings: No rash.  Neurological:     Mental Status: The patient is awake and alert.      ED Results / Procedures / Treatments   Labs (all labs ordered are listed, but only abnormal results are displayed) Labs Reviewed  BASIC METABOLIC PANEL - Abnormal; Notable for the following components:      Result Value   Glucose, Bld 206 (*)    All other components within normal limits  CBC WITH DIFFERENTIAL/PLATELET - Abnormal; Notable for the following components:   RBC 5.89 (*)    All other components within normal limits  URINALYSIS, W/ REFLEX TO CULTURE (INFECTION SUSPECTED) - Abnormal; Notable for the following components:   Color,  Urine YELLOW (*)    APPearance CLEAR (*)    Glucose, UA 50 (*)    Bacteria, UA RARE (*)    All other components within normal limits     EKG     RADIOLOGY I independently reviewed and interpreted imaging and agree with radiologists findings.     PROCEDURES:  Critical Care performed:   Procedures   MEDICATIONS ORDERED IN ED: Medications  sodium chloride 0.9 % bolus 1,000 mL (0 mLs Intravenous Stopped 09/17/22 1140)  ketorolac (TORADOL) 15 MG/ML injection 15 mg (15 mg Intravenous Given 09/17/22 0943)  ondansetron (ZOFRAN) injection 4 mg (4 mg Intravenous Given 09/17/22 0943)  iohexol (OMNIPAQUE) 300 MG/ML solution 100 mL (100 mLs Intravenous Contrast Given 09/17/22 1017)     IMPRESSION / MDM / ASSESSMENT AND PLAN / ED COURSE  I reviewed the triage vital signs and the nursing notes.   Differential diagnosis includes, but is not limited to, nephrolithiasis, ureteral colic, urinary tract infection, pyelonephritis, lumbar strain, disc herniation.  I reviewed the patient's chart.  He has not had any recent emergency department visits to this ER.  Further workup is indicated.  Labs obtained are overall reassuring with the exception of hyperglycemia, though normal bicarb, no increased anion gap, not consistent with DKA.  No leukocytosis.  Urinalysis is not suggestive of infection, no hematuria.  Given no history of previous similar pain, I recommend a CT scan which patient is in agreement with.  In the meantime, he was treated symptomatically with fluids, Toradol, Zofran with significant improvement of his pain.  CT scan reveals no acute intra-abdominal or intrapelvic abnormalities, though does see a posterior disc herniation at L5-S1 which is exactly the location of this pain.  There are no signs or symptoms of cord compression.  There were no radicular symptoms, likely given that this is a posterior herniation.  I discussed these findings with the patient.  He was started on  steroidal and nonsteroidal anti-inflammatories, though he was advised that the steroidal anti-inflammatories may cause fluctuations in his blood sugar.  Patient denies having known history of diabetes, I recommended that he follow-up with his outpatient provider for further workup and management of possible diabetes.  I also recommended that he not do any heavy lifting, or bending from the waist.  We discussed strict return precautions and the importance  of close outpatient follow-up.  He was also given the information for spine surgery if his symptoms do not improve.  Patient understands and agrees with plan.  He was discharged in stable condition.   Patient's presentation is most consistent with acute complicated illness / injury requiring diagnostic workup.    FINAL CLINICAL IMPRESSION(S) / ED DIAGNOSES   Final diagnoses:  Herniation of intervertebral disc between L5 and S1     Rx / DC Orders   ED Discharge Orders          Ordered    predniSONE (STERAPRED UNI-PAK 21 TAB) 10 MG (21) TBPK tablet        09/17/22 1111    naproxen (NAPROSYN) 500 MG tablet  2 times daily with meals        09/17/22 1111             Note:  This document was prepared using Dragon voice recognition software and may include unintentional dictation errors.   Keturah Shavers 09/17/22 1435    Jene Every, MD 09/19/22 [phone]

## 2022-09-17 NOTE — Discharge Instructions (Addendum)
Take the antibiotics as prescribed. Please return to the emergency department for any new, worsening, or changing symptoms or other concerns including weakness in your legs, urinary or stool incontinence or retention, numbness or tingling in your extremities/buttocks/groin, fevers, or any other concerns or change in symptoms.

## 2022-09-17 NOTE — ED Triage Notes (Signed)
Pt states that he started having right flank pain yesterday, states some nausea this am, pt states that he's had cold chills, reports never having a kidney stone, but states his mom has

## 2022-09-18 ENCOUNTER — Encounter: Payer: Self-pay | Admitting: Physician Assistant

## 2022-09-18 ENCOUNTER — Ambulatory Visit: Payer: BC Managed Care – PPO | Admitting: Physician Assistant

## 2022-09-18 VITALS — BP 134/106 | HR 75 | Temp 98.6°F | Ht 73.0 in | Wt 234.0 lb

## 2022-09-18 DIAGNOSIS — M545 Low back pain, unspecified: Secondary | ICD-10-CM

## 2022-09-18 DIAGNOSIS — E6609 Other obesity due to excess calories: Secondary | ICD-10-CM | POA: Diagnosis not present

## 2022-09-18 DIAGNOSIS — R7309 Other abnormal glucose: Secondary | ICD-10-CM

## 2022-09-18 DIAGNOSIS — M5127 Other intervertebral disc displacement, lumbosacral region: Secondary | ICD-10-CM

## 2022-09-18 DIAGNOSIS — I10 Essential (primary) hypertension: Secondary | ICD-10-CM | POA: Diagnosis not present

## 2022-09-18 DIAGNOSIS — Z6830 Body mass index (BMI) 30.0-30.9, adult: Secondary | ICD-10-CM

## 2022-09-18 LAB — POCT GLYCOSYLATED HEMOGLOBIN (HGB A1C): Hemoglobin A1C: 4.9 % (ref 4.0–5.6)

## 2022-09-18 NOTE — Progress Notes (Signed)
Established patient visit  Patient: Matthew York   DOB: 01/06/1981   42 y.o. Male  MRN: [id number] Visit Date: 09/18/2022  Today's healthcare provider: Debera Lat, PA-C   Chief Complaint  Patient presents with   Hyperglycemia    Patient was seen in the ER yesterday and was noted to have an elevated glucose of 206.  Patient reports this was not a fasting glucose.  He had eaten 2 bowls of rice krispies prior to going to the ER.   Hypertension    Patient also had elevate BP reading while at the ER yesterday.  It was 149/98.  He reports that he has not taken his Metoprolol yesterday and has not yet today.  Also patient was being seen for back pain and his pain may have contributed.      Subjective       Comments   It is of note for glucose and blood pressure that the patient had not taken his medications prior to the ER visit yesterday due to not knowing what they may need to do.  It is also of note that the patient has been given Prednisone but has not had it today.  He is fasting and has had no medications as he didn't know what would be needed today.  Patient also reports having gained some weight that he had previously lost.      Last edited by Matthew York, CMA on 09/18/2022  8:43 AM.      Discussed the use of AI scribe software for clinical note transcription with the patient, who gave verbal consent to proceed.  History of Present Illness   The patient, with a history of high blood sugar and hypertension, presents with concerns about their blood sugar levels and blood pressure. They report inconsistent exercise habits and acknowledge the need for dietary improvements. The patient is currently on metoprolol and amlodipine for blood pressure management.  In addition to these concerns, the patient is experiencing pain due to a herniated disc, which is more pronounced on the right side. 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 The patient has started a course of prednisone for the herniated disc and is considering further treatment options, including potential referral to orthopedics.           03/20/2022    9:15 AM 02/07/2022    8:53 AM 01/17/2020    1:18 PM  Depression screen PHQ 2/9  Decreased Interest 0 0 0  Down, Depressed, Hopeless 0 0 0  PHQ - 2 Score 0 0 0  Altered sleeping 0 0   Tired, decreased energy 0 0   Change in appetite 0 0   Feeling bad or failure about yourself  0 0   Trouble concentrating 0 0   Moving slowly or fidgety/restless 0 0   Suicidal thoughts 0 0   PHQ-9 Score 0 0   Difficult doing work/chores Not difficult at all Not difficult at all        No data to display          Medications: Outpatient Medications Prior to Visit  Medication Sig   amLODipine (NORVASC) 10 MG tablet TAKE 1 TABLET BY MOUTH EVERY DAY   atorvastatin (LIPITOR) 40 MG tablet TAKE 1 TABLET BY MOUTH EVERY DAY   levothyroxine (SYNTHROID) 88 MCG tablet TAKE 1 TABLET BY MOUTH EVERY DAY BEFORE BREAKFAST   metoprolol succinate (TOPROL-XL) 50 MG 24 hr  tablet TAKE 1 TABLET BY MOUTH EVERY DAY WITH OR IMMEDIATELY FOLLOWING A MEAL   naproxen (NAPROSYN) 500 MG tablet Take 1 tablet (500 mg total) by mouth 2 (two) times daily with a meal for 7 days.   predniSONE (STERAPRED UNI-PAK 21 TAB) 10 MG (21) TBPK tablet Take 6 tablets by mouth at once on day 1 and decrease by 1 tablet for each subsequent day   tadalafil (CIALIS) 5 MG tablet TAKE 1 TABLET BY MOUTH DAILY AS NEEDED FOR ERECTILE DYSFUNCTION.   No facility-administered medications prior to visit.    Review of Systems  All other systems reviewed and are negative.  Except see HPI        Objective    BP (!) 134/106   Pulse 75   Temp 98.6 F (37 C) (Oral)   Ht 6\' 1"  (1.854 m)   Wt 234 lb (106.1 kg)   SpO2 99%   BMI 30.87 kg/m      Physical Exam Vitals reviewed.  Constitutional:      General: He is not in  acute distress.    Appearance: Normal appearance. He is not diaphoretic.  HENT:     Head: Normocephalic and atraumatic.  Eyes:     General: No scleral icterus.    Conjunctiva/sclera: Conjunctivae normal.  Cardiovascular:     Rate and Rhythm: Normal rate and regular rhythm.     Pulses: Normal pulses.     Heart sounds: Normal heart sounds. No murmur heard. Pulmonary:     Effort: Pulmonary effort is normal. No respiratory distress.     Breath sounds: Normal breath sounds. No wheezing or rhonchi.  Musculoskeletal:        General: Tenderness (lower back pain) present.     Cervical back: Neck supple.     Right lower leg: No edema.     Left lower leg: No edema.  Lymphadenopathy:     Cervical: No cervical adenopathy.  Skin:    General: Skin is warm and dry.     Findings: No rash.  Neurological:     Mental Status: He is alert and oriented to person, place, and time. Mental status is at baseline.  Psychiatric:        Behavior: Behavior normal.        Thought Content: Thought content normal.        Judgment: Judgment normal.      Results for orders placed or performed in visit on 09/18/22  POCT glycosylated hemoglobin (Hb A1C)  Result Value Ref Range   Hemoglobin A1C 4.9 4.0 - 5.6 %   HbA1c POC (<> result, manual entry)     HbA1c, POC (prediabetic range)     HbA1c, POC (controlled diabetic range)      Assessment & Plan        Acute lower back pain Herniated Disc: Currently on conservative treatment with Prednisone. Discussed the potential need for orthopedic referral if no improvement in 6-8 weeks. -Continue Prednisone as prescribed. -Consider use of a brace and physical therapy when able to move.  Hyperglycemia: Recent high blood sugar readings. Awaiting A1c results to determine if in prediabetic or diabetic range. Discussed the importance of diet and exercise in managing blood sugar levels. -Check A1c -Consider starting Metformin if A1c is in high prediabetic or diabetic  range. -Encourage low carb diet and regular exercise.  Hypertension: Chronic and unstable. Currently on Metoprolol 50mg  and Amlodipine 10mg . Blood pressure readings vary, with some high readings. Discussed the importance of  regular monitoring and lifestyle modifications. -Continue Metoprolol and Amlodipine as prescribed. -Monitor blood pressure twice daily and record readings. -Encourage regular exercise and low salt diet.  Obesity Chronic and BMI 30.8 today Pt needs to proceed with strict low-carb, low-fat diet and daily exercise. Weight loss of 5% of pt's current weight via healthy diet and daily exercise encouraged.  Will reassess  Follow-up in 2 months on November 19, 2022. Bring blood pressure log and device to next appointment.     Return in about 2 months (around 11/18/2022) for chronic disease f/u.     The patient was advised to call back or seek an in-person evaluation if the symptoms worsen or if the condition fails to improve as anticipated.  I discussed the assessment and treatment plan with the patient. The patient was provided an opportunity to ask questions and all were answered. The patient agreed with the plan and demonstrated an understanding of the instructions.  I, Debera Lat, PA-C have reviewed all documentation for this visit. The documentation on  09/18/22 for the exam, diagnosis, procedures, and orders are all accurate and complete.  Debera Lat, [facility], MMS [facility] [phone] (phone) [phone] (fax)  [facility]

## 2022-10-05 ENCOUNTER — Other Ambulatory Visit: Payer: Self-pay | Admitting: Physician Assistant

## 2022-10-05 DIAGNOSIS — I10 Essential (primary) hypertension: Secondary | ICD-10-CM

## 2022-10-24 ENCOUNTER — Ambulatory Visit (INDEPENDENT_AMBULATORY_CARE_PROVIDER_SITE_OTHER): Payer: BC Managed Care – PPO | Admitting: Orthopaedic Surgery

## 2022-10-24 DIAGNOSIS — M545 Low back pain, unspecified: Secondary | ICD-10-CM | POA: Diagnosis not present

## 2022-10-24 NOTE — Progress Notes (Signed)
Chief Complaint: Lower back pain     History of Present Illness:    Matthew York is a 42 y.o. male presents today with right lower that is been ongoing for which she went to the emergency room.  At that time a CT was obtained to rule out a kidney stone which did not show this but did show evidence of an L5-S1 vacuum disc.  He was given a Medrol Dosepak which did help in approximately 4 to 5 days.  He is here today for discussion.  His back overall does feel much improved   Surgical History:   None  PMH/PSH/Family History/Social History/Meds/Allergies:    Past Medical History:  Diagnosis Date   Elevated blood pressure    Past Surgical History:  Procedure Laterality Date   VASECTOMY  2014   Social History   Socioeconomic History   Marital status: Married    Spouse name: Not on file   Number of children: Not on file   Years of education: Not on file   Highest education level: Not on file  Occupational History   Not on file  Tobacco Use   Smoking status: Never   Smokeless tobacco: Never  Substance and Sexual Activity   Alcohol use: Yes    Comment: Occasional use   Drug use: No   Sexual activity: Yes  Other Topics Concern   Not on file  Social History Narrative   Not on file   Social Determinants of Health   Financial Resource Strain: Not on file  Food Insecurity: Not on file  Transportation Needs: Not on file  Physical Activity: Not on file  Stress: Not on file  Social Connections: Not on file   Family History  Problem Relation Age of Onset   Hypertension Father    No Known Allergies Current Outpatient Medications  Medication Sig Dispense Refill   amLODipine (NORVASC) 10 MG tablet TAKE 1 TABLET BY MOUTH EVERY DAY 90 tablet 1   atorvastatin (LIPITOR) 40 MG tablet TAKE 1 TABLET BY MOUTH EVERY DAY 90 tablet 3   levothyroxine (SYNTHROID) 88 MCG tablet TAKE 1 TABLET BY MOUTH EVERY DAY BEFORE BREAKFAST 90 tablet 1   metoprolol  succinate (TOPROL-XL) 50 MG 24 hr tablet TAKE 1 TABLET BY MOUTH EVERY DAY WITH OR IMMEDIATELY FOLLOWING A MEAL 90 tablet 1   predniSONE (STERAPRED UNI-PAK 21 TAB) 10 MG (21) TBPK tablet Take 6 tablets by mouth at once on day 1 and decrease by 1 tablet for each subsequent day 21 tablet 0   tadalafil (CIALIS) 5 MG tablet TAKE 1 TABLET BY MOUTH DAILY AS NEEDED FOR ERECTILE DYSFUNCTION. 30 tablet 3   No current facility-administered medications for this visit.   No results found.  Review of Systems:   A ROS was performed including pertinent positives and negatives as documented in the HPI.  Physical Exam :   Constitutional: NAD and appears stated age Neurological: Alert and oriented Psych: Appropriate affect and cooperative There were no vitals taken for this visit.   Comprehensive Musculoskeletal Exam:    Tenderness to palpation about the right lower back.  There is no pain with forward flexion of the spine.  Negative straight leg raise.  Full neurosensory exam distally is intact with good strength in bilateral lower extremities and normal reflexes  Imaging:  CT scan lumbar spine: L5-S1 vacuum disc  I personally reviewed and interpreted the radiographs.   Assessment:   42 y.o. male with evidence of right perimuscular muscular irritation in the setting of a L5-S1 vacuum disc.  I did describe that he does not have any evidence of radiculopathy or stenosis type pain.  Given this I have recommended conservative treatment with a focus on the ergonomics of his back.  I did provide him a home back program.  I did also recommend a stretch routine such as stretch zone.  He will return to clinic as needed  Plan :    -Return to clinic as needed     I personally saw and evaluated the patient, and participated in the management and treatment plan.  Huel Cote, MD Attending Physician, Orthopedic Surgery  This document was dictated using Dragon voice recognition software. A reasonable  attempt at proof reading has been made to minimize errors.

## 2023-02-07 ENCOUNTER — Ambulatory Visit: Payer: BC Managed Care – PPO | Admitting: Family Medicine

## 2023-02-07 VITALS — BP 132/98 | Temp 98.1°F | Ht 73.0 in | Wt 230.0 lb

## 2023-02-07 DIAGNOSIS — J069 Acute upper respiratory infection, unspecified: Secondary | ICD-10-CM | POA: Diagnosis not present

## 2023-02-07 DIAGNOSIS — Z20822 Contact with and (suspected) exposure to covid-19: Secondary | ICD-10-CM | POA: Insufficient documentation

## 2023-02-07 DIAGNOSIS — I10 Essential (primary) hypertension: Secondary | ICD-10-CM

## 2023-02-07 DIAGNOSIS — J302 Other seasonal allergic rhinitis: Secondary | ICD-10-CM

## 2023-02-07 LAB — POCT INFLUENZA A/B
Influenza A, POC: NEGATIVE
Influenza B, POC: NEGATIVE

## 2023-02-07 LAB — POC COVID19 BINAXNOW: SARS Coronavirus 2 Ag: NEGATIVE

## 2023-02-07 MED ORDER — LOSARTAN POTASSIUM-HCTZ 100-25 MG PO TABS
1.0000 | ORAL_TABLET | Freq: Every day | ORAL | 0 refills | Status: DC
Start: 2023-02-07 — End: 2023-02-18

## 2023-02-07 MED ORDER — FLUTICASONE PROPIONATE 50 MCG/ACT NA SUSP
2.0000 | Freq: Every day | NASAL | 6 refills | Status: AC
Start: 2023-02-07 — End: ?

## 2023-02-07 MED ORDER — AZELASTINE HCL 0.1 % NA SOLN
2.0000 | Freq: Two times a day (BID) | NASAL | 12 refills | Status: AC
Start: 2023-02-07 — End: ?

## 2023-02-07 NOTE — Assessment & Plan Note (Signed)
Chronic, uncontrolled Recommend regimen switch with close f/u Goal remains 119/79

## 2023-02-07 NOTE — Progress Notes (Signed)
Established patient visit   Patient: Matthew York   DOB: 03-Apr-1980   42 y.o. Male  MRN: [id number] Visit Date: 02/07/2023  Today's healthcare provider: Jacky Kindle, FNP  Introduced to nurse practitioner role and practice setting.  All questions answered.  Discussed provider/patient relationship and expectations.  Chief Complaint  Patient presents with   Cough    Coughing w/ green/gray, pulse on the right ear, congested-6 days. Wife have  Dx Covid.   Subjective    HPI HPI     Cough    Additional comments: Coughing w/ green/gray, pulse on the right ear, congested-6 days. Wife have  Dx Covid.      Last edited by Shelly Bombard, CMA on 02/07/2023  2:56 PM.      Medications: Outpatient Medications Prior to Visit  Medication Sig   amLODipine (NORVASC) 10 MG tablet TAKE 1 TABLET BY MOUTH EVERY DAY   atorvastatin (LIPITOR) 40 MG tablet TAKE 1 TABLET BY MOUTH EVERY DAY   levothyroxine (SYNTHROID) 88 MCG tablet TAKE 1 TABLET BY MOUTH EVERY DAY BEFORE BREAKFAST   metoprolol succinate (TOPROL-XL) 50 MG 24 hr tablet TAKE 1 TABLET BY MOUTH EVERY DAY WITH OR IMMEDIATELY FOLLOWING A MEAL   predniSONE (STERAPRED UNI-PAK 21 TAB) 10 MG (21) TBPK tablet Take 6 tablets by mouth at once on day 1 and decrease by 1 tablet for each subsequent day   tadalafil (CIALIS) 5 MG tablet TAKE 1 TABLET BY MOUTH DAILY AS NEEDED FOR ERECTILE DYSFUNCTION.   No facility-administered medications prior to visit.   Last CBC Lab Results  Component Value Date   WBC 6.8 09/17/2022   HGB 16.4 09/17/2022   HCT 47.8 09/17/2022   MCV 81.2 09/17/2022   MCH 27.8 09/17/2022   RDW 12.2 09/17/2022   PLT 240 09/17/2022   Last metabolic panel Lab Results  Component Value Date   GLUCOSE 206 (H) 09/17/2022   NA 136 09/17/2022   K 3.8 09/17/2022   CL 104 09/17/2022   CO2 23 09/17/2022   BUN 15 09/17/2022   CREATININE 0.91 09/17/2022   GFRNONAA >60 09/17/2022   CALCIUM 9.2 09/17/2022   PROT 7.0 02/07/2022    ALBUMIN 4.5 02/07/2022   LABGLOB 2.5 02/07/2022   AGRATIO 1.8 02/07/2022   BILITOT 1.1 02/07/2022   ALKPHOS 67 02/07/2022   AST 19 02/07/2022   ALT 27 02/07/2022   ANIONGAP 9 09/17/2022   Last lipids Lab Results  Component Value Date   CHOL 135 02/07/2022   HDL 35 (L) 02/07/2022   LDLCALC 78 02/07/2022   TRIG 124 02/07/2022   CHOLHDL 3.9 02/07/2022   Last hemoglobin A1c Lab Results  Component Value Date   HGBA1C 4.9 09/18/2022   Last thyroid functions Lab Results  Component Value Date   TSH 2.890 09/17/2021   T4TOTAL 7.1 07/30/2019   Last vitamin D No results found for: "25OHVITD2", "25OHVITD3", "VD25OH" Last vitamin B12 and Folate No results found for: "VITAMINB12", "FOLATE"      Objective    BP (!) 132/98 (BP Location: Right Arm, Patient Position: Sitting, Cuff Size: Large)   Temp 98.1 F (36.7 C)   Ht 6\' 1"  (1.854 m)   Wt 230 lb (104.3 kg)   SpO2 98%   BMI 30.34 kg/m   BP Readings from Last 3 Encounters:  02/07/23 (!) 132/98  09/18/22 (!) 134/106  09/17/22 (!) 149/98   Wt Readings from Last 3 Encounters:  02/07/23 230 lb (104.3 kg)  09/18/22 234 lb (106.1 kg)  09/17/22 230 lb (104.3 kg)   SpO2 Readings from Last 3 Encounters:  02/07/23 98%  09/18/22 99%  09/17/22 98%      Physical Exam Vitals and nursing note reviewed.  Constitutional:      Appearance: Normal appearance. He is normal weight.  HENT:     Head: Normocephalic and atraumatic.     Right Ear: Tympanic membrane, ear canal and external ear normal.     Left Ear: Tympanic membrane, ear canal and external ear normal.     Nose: Congestion present.     Mouth/Throat:     Mouth: Mucous membranes are moist.     Pharynx: Oropharynx is clear. No oropharyngeal exudate or posterior oropharyngeal erythema.  Eyes:     Extraocular Movements: Extraocular movements intact.     Conjunctiva/sclera: Conjunctivae normal.  Cardiovascular:     Rate and Rhythm: Normal rate and regular rhythm.      Pulses: Normal pulses.     Heart sounds: Normal heart sounds.  Pulmonary:     Effort: Pulmonary effort is normal.     Breath sounds: Normal breath sounds.  Musculoskeletal:        General: Normal range of motion.     Cervical back: Normal range of motion and neck supple.  Skin:    General: Skin is warm and dry.     Capillary Refill: Capillary refill takes less than 2 seconds.  Neurological:     General: No focal deficit present.     Mental Status: He is alert and oriented to person, place, and time. Mental status is at baseline.  Psychiatric:        Mood and Affect: Mood normal.        Behavior: Behavior normal.        Thought Content: Thought content normal.        Judgment: Judgment normal.     Results for orders placed or performed in visit on 02/07/23  POCT Influenza A/B  Result Value Ref Range   Influenza A, POC Negative Negative   Influenza B, POC Negative Negative  POC COVID-19  Result Value Ref Range   SARS Coronavirus 2 Ag Negative Negative    Assessment & Plan     Problem List Items Addressed This Visit       Cardiovascular and Mediastinum   Hypertension   Chronic, uncontrolled Recommend regimen switch with close f/u Goal remains 119/79      Relevant Medications   losartan-hydrochlorothiazide (HYZAAR) 100-25 MG tablet     Respiratory   Allergic rhinitis, seasonal   Recommend trial of nasal sprays to assist      Viral URI with cough   Negative POC FluA/Flu B and COVID Your symptoms and exam findings are most consistent with a viral upper respiratory infection. These usually run their course in 5-7 days. Unfortunately, antibiotics don't work against viruses and just increase your risk of other issues such as diarrhea, yeast infections, and resistant infections.  If you start feeling worse with facial pain, high fever, cough, shortness of breath or start feeling significantly worse, please call us right away to be further evaluated.  Some things that can  make you feel better are: - Increased rest - Increasing Fluids - Acetaminophen / ibuprofen as needed for fever/pain.  - Salt water gargling, chloraseptic spray and throat lozenges - OTC pseudoephedrine.  - Mucinex.  - Saline sinus flushes or a neti pot.  - Humidifying the air.  Other   Exposure to COVID-19 virus - Primary   Relevant Orders   POCT Influenza A/B (Completed)   POC COVID-19 (Completed)   No follow-ups on file.     Leilani Merl, FNP, have reviewed all documentation for this visit. The documentation on 02/07/23 for the exam, diagnosis, procedures, and orders are all accurate and complete.  Jacky Kindle, FNP  [facility] [phone] (phone) [phone] (fax)  [facility]

## 2023-02-07 NOTE — Assessment & Plan Note (Signed)
Recommend trial of nasal sprays to assist

## 2023-02-07 NOTE — Assessment & Plan Note (Signed)
Negative POC FluA/Flu B and COVID Your symptoms and exam findings are most consistent with a viral upper respiratory infection. These usually run their course in 5-7 days. Unfortunately, antibiotics don't work against viruses and just increase your risk of other issues such as diarrhea, yeast infections, and resistant infections.  If you start feeling worse with facial pain, high fever, cough, shortness of breath or start feeling significantly worse, please call us right away to be further evaluated.  Some things that can make you feel better are: - Increased rest - Increasing Fluids - Acetaminophen / ibuprofen as needed for fever/pain.  - Salt water gargling, chloraseptic spray and throat lozenges - OTC pseudoephedrine.  - Mucinex.  - Saline sinus flushes or a neti pot.  - Humidifying the air.

## 2023-02-07 NOTE — Patient Instructions (Signed)
12/21- Stop Norvasc; 1/2 Metop; add Hyzaar 12/22- 12/25 Continue 1/2 Metop and Hyzaar After 12/25- continue Hyzaar daily, hold Metop

## 2023-02-09 ENCOUNTER — Other Ambulatory Visit: Payer: Self-pay | Admitting: Physician Assistant

## 2023-02-09 DIAGNOSIS — N529 Male erectile dysfunction, unspecified: Secondary | ICD-10-CM

## 2023-02-18 ENCOUNTER — Encounter: Payer: Self-pay | Admitting: Family Medicine

## 2023-02-18 ENCOUNTER — Ambulatory Visit: Payer: BC Managed Care – PPO | Admitting: Family Medicine

## 2023-02-18 VITALS — BP 126/87 | HR 80 | Ht 73.0 in | Wt 229.0 lb

## 2023-02-18 DIAGNOSIS — N529 Male erectile dysfunction, unspecified: Secondary | ICD-10-CM | POA: Diagnosis not present

## 2023-02-18 DIAGNOSIS — Z6830 Body mass index (BMI) 30.0-30.9, adult: Secondary | ICD-10-CM

## 2023-02-18 DIAGNOSIS — I10 Essential (primary) hypertension: Secondary | ICD-10-CM

## 2023-02-18 DIAGNOSIS — E6609 Other obesity due to excess calories: Secondary | ICD-10-CM

## 2023-02-18 DIAGNOSIS — E66811 Obesity, class 1: Secondary | ICD-10-CM | POA: Insufficient documentation

## 2023-02-18 DIAGNOSIS — E782 Mixed hyperlipidemia: Secondary | ICD-10-CM | POA: Diagnosis not present

## 2023-02-18 MED ORDER — LOSARTAN POTASSIUM-HCTZ 100-25 MG PO TABS
1.0000 | ORAL_TABLET | Freq: Every day | ORAL | 3 refills | Status: DC
Start: 2023-02-18 — End: 2023-03-03

## 2023-02-18 NOTE — Assessment & Plan Note (Signed)
 Chronic Body mass index is 30.21 kg/m. Continue to recommend balanced, lower carb meals. Smaller meal size, adding snacks. Choosing water as drink of choice and increasing purposeful exercise.

## 2023-02-18 NOTE — Assessment & Plan Note (Signed)
 Chronic, some changes titrating off BB and starting hyzaar 100-25 Continue to monitor Discussed importance of healthy weight management Discussed diet and exercise

## 2023-02-18 NOTE — Progress Notes (Signed)
 Established patient visit  Patient: Matthew York   DOB: Dec 18, 1980   42 y.o. Male  MRN: [id number] Visit Date: 02/18/2023  Today's healthcare provider: Kelly ONEIDA Cedar, FNP  Introduced to nurse practitioner role and practice setting.  All questions answered.  Discussed provider/patient relationship and expectations.  Chief Complaint  Patient presents with   blood pressure   Subjective    HPI   Ongoing cough since recent URI; seen 2 weeks ago for URI. Heaviness noted in chest with some DOE since   Home BP has been checked  Competing with Cialis ; denies failure of erection.  Medications: Outpatient Medications Prior to Visit  Medication Sig Note   amLODipine  (NORVASC ) 10 MG tablet TAKE 1 TABLET BY MOUTH EVERY DAY    azelastine  (ASTELIN ) 0.1 % nasal spray Place 2 sprays into both nostrils 2 (two) times daily. Use in each nostril as directed    fluticasone  (FLONASE ) 50 MCG/ACT nasal spray Place 2 sprays into both nostrils daily.    levothyroxine  (SYNTHROID ) 88 MCG tablet TAKE 1 TABLET BY MOUTH EVERY DAY BEFORE BREAKFAST    losartan -hydrochlorothiazide (HYZAAR) 100-25 MG tablet Take 1 tablet by mouth daily.    tadalafil  (CIALIS ) 5 MG tablet TAKE 1 TABLET BY MOUTH DAILY AS NEEDED FOR ERECTILE DYSFUNCTION.    atorvastatin  (LIPITOR) 40 MG tablet TAKE 1 TABLET BY MOUTH EVERY DAY (Patient not taking: Reported on 02/18/2023) 02/18/2023: Pt d/c 2.5 months   metoprolol  succinate (TOPROL -XL) 50 MG 24 hr tablet TAKE 1 TABLET BY MOUTH EVERY DAY WITH OR IMMEDIATELY FOLLOWING A MEAL (Patient not taking: Reported on 02/18/2023)    [DISCONTINUED] predniSONE  (STERAPRED UNI-PAK 21 TAB) 10 MG (21) TBPK tablet Take 6 tablets by mouth at once on day 1 and decrease by 1 tablet for each subsequent day    No facility-administered medications prior to visit.   Last CBC Lab Results  Component Value Date   WBC 6.8 09/17/2022   HGB 16.4 09/17/2022   HCT 47.8 09/17/2022   MCV 81.2 09/17/2022   MCH  27.8 09/17/2022   RDW 12.2 09/17/2022   PLT 240 09/17/2022   Last metabolic panel Lab Results  Component Value Date   GLUCOSE 206 (H) 09/17/2022   NA 136 09/17/2022   K 3.8 09/17/2022   CL 104 09/17/2022   CO2 23 09/17/2022   BUN 15 09/17/2022   CREATININE 0.91 09/17/2022   GFRNONAA >60 09/17/2022   CALCIUM  9.2 09/17/2022   PROT 7.0 02/07/2022   ALBUMIN 4.5 02/07/2022   LABGLOB 2.5 02/07/2022   AGRATIO 1.8 02/07/2022   BILITOT 1.1 02/07/2022   ALKPHOS 67 02/07/2022   AST 19 02/07/2022   ALT 27 02/07/2022   ANIONGAP 9 09/17/2022   Last lipids Lab Results  Component Value Date   CHOL 135 02/07/2022   HDL 35 (L) 02/07/2022   LDLCALC 78 02/07/2022   TRIG 124 02/07/2022   CHOLHDL 3.9 02/07/2022   Last hemoglobin A1c Lab Results  Component Value Date   HGBA1C 4.9 09/18/2022   Last thyroid  functions Lab Results  Component Value Date   TSH 2.890 09/17/2021   T4TOTAL 7.1 07/30/2019   Last vitamin D No results found for: 25OHVITD2, 25OHVITD3, VD25OH Last vitamin B12 and Folate No results found for: VITAMINB12, FOLATE     Objective    BP 126/87 (BP Location: Right Arm, Patient Position: Sitting, Cuff Size: Normal)   Pulse 80   Ht 6' 1 (1.854 m)   Wt 229 lb (  103.9 kg)   BMI 30.21 kg/m   BP Readings from Last 3 Encounters:  02/18/23 126/87  02/07/23 (!) 132/98  09/18/22 (!) 134/106   Wt Readings from Last 3 Encounters:  02/18/23 229 lb (103.9 kg)  02/07/23 230 lb (104.3 kg)  09/18/22 234 lb (106.1 kg)   SpO2 Readings from Last 3 Encounters:  02/07/23 98%  09/18/22 99%  09/17/22 98%   Physical Exam Vitals and nursing note reviewed.  Constitutional:      Appearance: Normal appearance. He is obese.  HENT:     Head: Normocephalic and atraumatic.     Right Ear: Tympanic membrane, ear canal and external ear normal.     Left Ear: Tympanic membrane, ear canal and external ear normal.     Nose: Nose normal.     Mouth/Throat:     Mouth: Mucous  membranes are moist.     Pharynx: Oropharynx is clear.  Eyes:     Conjunctiva/sclera: Conjunctivae normal.  Cardiovascular:     Rate and Rhythm: Normal rate and regular rhythm.     Pulses: Normal pulses.     Heart sounds: Normal heart sounds.  Pulmonary:     Effort: Pulmonary effort is normal.     Breath sounds: Normal breath sounds.     Comments: Residual DOE from viral cough; non productive. Musculoskeletal:        General: Normal range of motion.     Cervical back: Normal range of motion.  Skin:    General: Skin is warm and dry.     Capillary Refill: Capillary refill takes less than 2 seconds.  Neurological:     General: No focal deficit present.     Mental Status: He is alert and oriented to person, place, and time. Mental status is at baseline.  Psychiatric:        Mood and Affect: Mood normal.        Behavior: Behavior normal.        Thought Content: Thought content normal.        Judgment: Judgment normal.     No results found for any visits on 02/18/23.  Assessment & Plan     Problem List Items Addressed This Visit       Cardiovascular and Mediastinum   Hypertension - Primary   Chronic, borderline elevation Goal remains 119/79 or lower Defer additional med changes and continue lifestyle to assist -Continue norvasc  10 mg -Continue hyzaar 100-25 Successfully weaned off Toprol  50 mg without complaints of rebound tachycardia or HTN        Other   Class 1 obesity due to excess calories with body mass index (BMI) of 30.0 to 30.9 in adult   Chronic Body mass index is 30.21 kg/m. Continue to recommend balanced, lower carb meals. Smaller meal size, adding snacks. Choosing water as drink of choice and increasing purposeful exercise.       Erectile dysfunction   Chronic, some changes titrating off BB and starting hyzaar 100-25 Continue to monitor Discussed importance of healthy weight management Discussed diet and exercise       Mixed hyperlipidemia    Chronic, LDL elevated previously The 10-year ASCVD risk score (Arnett DK, et al., 2019) is: 1.6% Has since stopped use of lipitor 40 recommend diet low in saturated fat and regular exercise - 30 min at least 5 times per week       Return if symptoms worsen or fail to improve.     LILLETTE Kelly ONEIDA Emilio,  FNP, have reviewed all documentation for this visit. The documentation on 02/18/23 for the exam, diagnosis, procedures, and orders are all accurate and complete.  Kelly ONEIDA Cedar, FNP  [facility] [phone] (phone) [phone] (fax)  [facility]

## 2023-02-18 NOTE — Assessment & Plan Note (Signed)
 Chronic, borderline elevation Goal remains 119/79 or lower Defer additional med changes and continue lifestyle to assist -Continue norvasc  10 mg -Continue hyzaar 100-25 Successfully weaned off Toprol  50 mg without complaints of rebound tachycardia or HTN

## 2023-02-18 NOTE — Assessment & Plan Note (Signed)
 Chronic, LDL elevated previously The 10-year ASCVD risk score (Arnett DK, et al., 2019) is: 1.6% Has since stopped use of lipitor 40 recommend diet low in saturated fat and regular exercise - 30 min at least 5 times per week

## 2023-03-03 ENCOUNTER — Telehealth: Payer: Self-pay | Admitting: Physician Assistant

## 2023-03-03 DIAGNOSIS — I10 Essential (primary) hypertension: Secondary | ICD-10-CM

## 2023-03-03 MED ORDER — LOSARTAN POTASSIUM-HCTZ 100-25 MG PO TABS: 1.0000 | ORAL_TABLET | Freq: Every day | ORAL | 3 refills | Status: DC

## 2023-03-03 NOTE — Telephone Encounter (Signed)
 [facility] faxed refill request for the following medications:   losartan-hydrochlorothiazide (HYZAAR) 100-25 MG tablet   Requesting a 90 day supply  Please advise.

## 2023-04-11 ENCOUNTER — Other Ambulatory Visit: Payer: Self-pay | Admitting: Family

## 2023-04-11 DIAGNOSIS — I10 Essential (primary) hypertension: Secondary | ICD-10-CM

## 2023-05-26 ENCOUNTER — Other Ambulatory Visit: Payer: Self-pay | Admitting: Physician Assistant

## 2023-05-26 DIAGNOSIS — E039 Hypothyroidism, unspecified: Secondary | ICD-10-CM

## 2023-06-23 ENCOUNTER — Ambulatory Visit: Payer: Self-pay

## 2023-06-23 NOTE — Telephone Encounter (Signed)
  Chief Complaint: Shortness of breath Symptoms: shortness of breath Frequency: intermittent Pertinent Negatives: Patient denies chest pain, and all other symptoms Disposition: [] ED /[] Urgent Care (no appt availability in office) / [x] Appointment(In office/virtual)/ []  [facility]/ [] Home Care/ [] Refused Recommended Disposition /[] [facility]/ []  Follow-up with PCP Additional Notes:  Called requesting appointment, transferred to nurse for reason: shortness of breath. Matthew York requesting evaluation for intermittent mild shortness of breath. He states it has been on going but noticed it "was a regular thing"  about 1.5 weeks ago. Occasionally when he climbs the stairs he will developed mild shortness of breath that lasts only a few moments and resolves. He has no other associated symptoms. Denies chest pain. Noted shallow slow breathing when resting. Offered acute visit 06/24/23 but patient declines this visit as he started a new job today and not wanting to take the day off. Scheduled next available acute visit with an alternate provider on 07/11/23, added to wait list for 06/25/23-07/10/23, Thursdays are best. Educated on care advice as documented in protocol, patient verbalized understanding. Discussed reasons to call back or call for EMS.    Copied from CRM [id number]. Topic: Clinical - Red Word Triage >> Jun 23, 2023  3:01 PM Carla L wrote: Red Word that prompted transfer to Nurse Triage: slower to breathe while sleeping a night, some moments of shortness of breath Reason for Disposition  [1] MILD longstanding difficulty breathing AND [2]  SAME as normal  Protocols used: Breathing Difficulty-A-AH

## 2023-06-30 ENCOUNTER — Ambulatory Visit: Payer: Self-pay | Admitting: Physician Assistant

## 2023-06-30 ENCOUNTER — Encounter (HOSPITAL_COMMUNITY): Payer: Self-pay

## 2023-06-30 ENCOUNTER — Encounter: Payer: Self-pay | Admitting: Physician Assistant

## 2023-06-30 VITALS — BP 129/84 | HR 77 | Resp 16 | Ht 73.0 in | Wt 230.0 lb

## 2023-06-30 DIAGNOSIS — E782 Mixed hyperlipidemia: Secondary | ICD-10-CM

## 2023-06-30 DIAGNOSIS — R0602 Shortness of breath: Secondary | ICD-10-CM

## 2023-06-30 DIAGNOSIS — R0683 Snoring: Secondary | ICD-10-CM

## 2023-06-30 DIAGNOSIS — Z6830 Body mass index (BMI) 30.0-30.9, adult: Secondary | ICD-10-CM

## 2023-06-30 DIAGNOSIS — E66811 Obesity, class 1: Secondary | ICD-10-CM | POA: Diagnosis not present

## 2023-06-30 DIAGNOSIS — I10 Essential (primary) hypertension: Secondary | ICD-10-CM

## 2023-06-30 DIAGNOSIS — E039 Hypothyroidism, unspecified: Secondary | ICD-10-CM

## 2023-06-30 DIAGNOSIS — J302 Other seasonal allergic rhinitis: Secondary | ICD-10-CM

## 2023-06-30 DIAGNOSIS — E6609 Other obesity due to excess calories: Secondary | ICD-10-CM

## 2023-06-30 DIAGNOSIS — Z1159 Encounter for screening for other viral diseases: Secondary | ICD-10-CM

## 2023-06-30 NOTE — Progress Notes (Signed)
 Established patient visit  Patient: Matthew York   DOB: October 15, 1980   43 y.o. Male  MRN: [id number] Visit Date: 06/30/2023  Today's healthcare provider: Blane Bunting, PA-C   Chief Complaint  Patient presents with   Shortness of Breath    SOB/ coldneess  arms and fingers tingling with nerve sensation.Aaron Aas Possible sleep Apnea. This has been going for about 2 weeks.   Subjective      Discussed the use of AI scribe software for clinical note transcription with the patient, who gave verbal consent to proceed.  History of Present Illness Matthew York is a 43 year old male who presents with concerns of sleep apnea and intermittent arm sensations.  His wife observed that he takes 30 to 45 seconds between breaths while sleeping. He does not wake up gasping for air but sometimes feels unrefreshed upon waking. He experiences shortness of breath when climbing stairs, which began a week and a half ago. There is no wheezing or coughing. Stress from a new job has reduced his exercise frequency.  Intermittent sensations in the left arm started a week ago, described as a cold sensation without numbness. The sensation improves with physical activity and is not associated with significant numbness or weakness.  He manages elevated blood pressure with amlodipine , losartan , hydrochlorothiazide, and Cialis . Blood pressure readings have improved since switching from metoprolol  to losartan , now in the 120s-130s/80s range.  He experiences sinus pressure and headaches, likely due to seasonal allergies, with increased sneezing and sinus issues over the past few years. He does not take antihistamines regularly. No family history of asthma or other respiratory conditions.       02/07/2023    3:08 PM 03/20/2022    9:15 AM 02/07/2022    8:53 AM  Depression screen PHQ 2/9  Decreased Interest 1 0 0  Down, Depressed, Hopeless 0 0 0  PHQ - 2 Score 1 0 0  Altered sleeping 0 0 0  Tired, decreased energy 1 0 0  Change  in appetite 2 0 0  Feeling bad or failure about yourself  0 0 0  Trouble concentrating 0 0 0  Moving slowly or fidgety/restless 0 0 0  Suicidal thoughts 0 0 0  PHQ-9 Score 4 0 0  Difficult doing work/chores Not difficult at all Not difficult at all Not difficult at all      02/07/2023    3:09 PM  GAD 7 : Generalized Anxiety Score  Nervous, Anxious, on Edge 0  Control/stop worrying 0  Worry too much - different things 0  Trouble relaxing 0  Restless 0  Easily annoyed or irritable 0  Afraid - awful might happen 0  Total GAD 7 Score 0  Anxiety Difficulty Not difficult at all    Medications: Outpatient Medications Prior to Visit  Medication Sig   amLODipine  (NORVASC ) 10 MG tablet TAKE 1 TABLET BY MOUTH EVERY DAY   azelastine  (ASTELIN ) 0.1 % nasal spray Place 2 sprays into both nostrils 2 (two) times daily. Use in each nostril as directed   fluticasone  (FLONASE ) 50 MCG/ACT nasal spray Place 2 sprays into both nostrils daily.   levothyroxine  (SYNTHROID ) 88 MCG tablet TAKE 1 TABLET BY MOUTH EVERY DAY BEFORE BREAKFAST   losartan -hydrochlorothiazide (HYZAAR) 100-25 MG tablet Take 1 tablet by mouth daily.   tadalafil  (CIALIS ) 5 MG tablet TAKE 1 TABLET BY MOUTH DAILY AS NEEDED FOR ERECTILE DYSFUNCTION.   No facility-administered medications prior to visit.    Review of Systems All negative Except  see HPI       Objective    BP 129/84 (BP Location: Left Arm, Patient Position: Sitting, Cuff Size: Large)   Pulse 77   Resp 16   Ht 6\' 1"  (1.854 m)   Wt 230 lb (104.3 kg)   SpO2 98%   BMI 30.34 kg/m     Physical Exam Vitals reviewed.  Constitutional:      General: He is not in acute distress.    Appearance: Normal appearance. He is obese. He is not diaphoretic.  HENT:     Head: Normocephalic and atraumatic.  Eyes:     General: No scleral icterus.    Conjunctiva/sclera: Conjunctivae normal.  Cardiovascular:     Rate and Rhythm: Normal rate and regular rhythm.      Pulses: Normal pulses.     Heart sounds: Normal heart sounds. No murmur heard. Pulmonary:     Effort: Pulmonary effort is normal. No respiratory distress.     Breath sounds: Normal breath sounds. No wheezing or rhonchi.  Musculoskeletal:     Cervical back: Neck supple.     Right lower leg: No edema.     Left lower leg: No edema.  Lymphadenopathy:     Cervical: No cervical adenopathy.  Skin:    General: Skin is warm and dry.     Findings: No rash.  Neurological:     Mental Status: He is alert and oriented to person, place, and time. Mental status is at baseline.  Psychiatric:        Mood and Affect: Mood normal.        Behavior: Behavior normal.      No results found for any visits on 06/30/23.      Assessment & Plan Hypertension Chronic Managed with amlodipine  10, losartan  100, hydrochlorothiazide 25, and Cialis  5. Home readings improved; in-office readings elevated. Dietary modifications crucial. Urine test to rule out diabetes. - Continue amlodipine , losartan , hydrochlorothiazide, and Cialis . - Encourage dietary modifications to reduce salt intake and maintain hydration. - Order blood work to assess underlying causes of elevated blood pressure, including cholesterol levels. - Perform urine test to rule out diabetes. - Reassess blood pressure control in 3 months. Will follow-up in 3 month  Sleep Apnea (suspected) Suspected based on observed breathing pauses and daytime fatigue. Discussed sleep study benefits and CPAP use if diagnosed. Untreated apnea may elevate blood pressure and cause fatigue. - Refer to pulmonology for sleep study evaluation. - Complete sleep study referral paperwork. - Consider home sleep study if qualified by pulmonology assessment.  Seasonal Allergies Increased sinus pressure, headaches, and ear congestion consistent with allergies. Discussed antihistamines and nasal sprays for management. - Recommend over-the-counter antihistamines: Allegra,  Claritin, or Zyrtec. - Advise use of nasal saline spray and Flonase  for symptom relief.  Class 1 obesity due to excess calories with body mass index (BMI) of 30.0 to 30.9 in adult, unspecified whether serious comorbidity present (Primary) Chronic Body mass index is 30.34 kg/m. Weight loss of 5% of pt's current weight via healthy diet and daily exercise encouraged. workup - CBC with Differential/Platelet - Comprehensive metabolic panel with GFR - Hemoglobin A1c - Lipid panel - TSH Will reassess after  receiving lab results  SOB (shortness of breath) Could be due to deconditioning or OSA Advised sleep studies and regular exercise, weight loss Will reassess  Mixed hyperlipidemia Chronic and previously stable, Currently not on med Advised weight loss via diet and exercise Will follow-up  Seasonal allergic rhinitis, unspecified trigger -  Avoidance measures discussed. - Use nasal saline rinses before nose sprays such as with Neilmed Sinus Rinse bottle.  Use distilled water.   - Use Flonase  2 sprays each nostril daily. Aim upward and outward. - Use Zyrtec 10 mg daily.   Hypothyroidism, unspecified type -tsh, takes synthroid  88mcg Will reasess   Need for hepatitis C screening test Low risk screening - Hepatitis C antibody  Snoring See ESS attached in media - Ambulatory referral to Sleep Studies   No orders of the defined types were placed in this encounter.   No follow-ups on file.   The patient was advised to call back or seek an in-person evaluation if the symptoms worsen or if the condition fails to improve as anticipated.  I discussed the assessment and treatment plan with the patient. The patient was provided an opportunity to ask questions and all were answered. The patient agreed with the plan and demonstrated an understanding of the instructions.  I, Abisola Carrero, PA-C have reviewed all documentation for this visit. The documentation on 06/30/2023  for the exam,  diagnosis, procedures, and orders are all accurate and complete.  Blane Bunting, [facility], MMS [facility] [phone] (phone) [phone] (fax)  [facility]

## 2023-07-07 ENCOUNTER — Ambulatory Visit: Admitting: Family Medicine

## 2023-07-10 ENCOUNTER — Other Ambulatory Visit: Payer: Self-pay | Admitting: Physician Assistant

## 2023-07-10 DIAGNOSIS — N529 Male erectile dysfunction, unspecified: Secondary | ICD-10-CM

## 2023-07-11 ENCOUNTER — Ambulatory Visit: Admitting: Family Medicine

## 2023-09-05 ENCOUNTER — Other Ambulatory Visit: Payer: Self-pay

## 2023-09-05 ENCOUNTER — Ambulatory Visit: Payer: Self-pay | Admitting: Physician Assistant

## 2023-09-05 LAB — CBC WITH DIFFERENTIAL/PLATELET
Basophils Absolute: 0 x10E3/uL (ref 0.0–0.2)
Basos: 1 %
EOS (ABSOLUTE): 0.1 x10E3/uL (ref 0.0–0.4)
Eos: 2 %
Hematocrit: 46.7 % (ref 37.5–51.0)
Hemoglobin: 15.7 g/dL (ref 13.0–17.7)
Immature Grans (Abs): 0 x10E3/uL (ref 0.0–0.1)
Immature Granulocytes: 0 %
Lymphocytes Absolute: 2.3 x10E3/uL (ref 0.7–3.1)
Lymphs: 40 %
MCH: 28.6 pg (ref 26.6–33.0)
MCHC: 33.6 g/dL (ref 31.5–35.7)
MCV: 85 fL (ref 79–97)
Monocytes Absolute: 0.5 x10E3/uL (ref 0.1–0.9)
Monocytes: 8 %
Neutrophils Absolute: 2.9 x10E3/uL (ref 1.4–7.0)
Neutrophils: 49 %
Platelets: 265 x10E3/uL (ref 150–450)
RBC: 5.49 x10E6/uL (ref 4.14–5.80)
RDW: 13.3 % (ref 11.6–15.4)
WBC: 5.8 x10E3/uL (ref 3.4–10.8)

## 2023-09-05 LAB — COMPREHENSIVE METABOLIC PANEL WITH GFR
ALT: 16 IU/L (ref 0–44)
AST: 15 IU/L (ref 0–40)
Albumin: 4.4 g/dL (ref 4.1–5.1)
Alkaline Phosphatase: 49 IU/L (ref 44–121)
BUN/Creatinine Ratio: 18 (ref 9–20)
BUN: 20 mg/dL (ref 6–24)
Bilirubin Total: 1.1 mg/dL (ref 0.0–1.2)
CO2: 22 mmol/L (ref 20–29)
Calcium: 9.7 mg/dL (ref 8.7–10.2)
Chloride: 101 mmol/L (ref 96–106)
Creatinine, Ser: 1.13 mg/dL (ref 0.76–1.27)
Globulin, Total: 2.5 g/dL (ref 1.5–4.5)
Glucose: 95 mg/dL (ref 70–99)
Potassium: 3.9 mmol/L (ref 3.5–5.2)
Sodium: 138 mmol/L (ref 134–144)
Total Protein: 6.9 g/dL (ref 6.0–8.5)
eGFR: 82 mL/min/1.73 (ref >59)

## 2023-09-05 LAB — LIPID PANEL
Chol/HDL Ratio: 5.5 ratio — ABNORMAL HIGH (ref 0.0–5.0)
Cholesterol, Total: 191 mg/dL (ref 100–199)
HDL: 35 mg/dL — ABNORMAL LOW (ref >39)
LDL Chol Calc (NIH): 120 mg/dL — ABNORMAL HIGH (ref 0–99)
Triglycerides: 203 mg/dL — ABNORMAL HIGH (ref 0–149)
VLDL Cholesterol Cal: 36 mg/dL (ref 5–40)

## 2023-09-05 LAB — TSH: TSH: 2.58 u[IU]/mL (ref 0.450–4.500)

## 2023-09-05 LAB — HEMOGLOBIN A1C
Est. average glucose Bld gHb Est-mCnc: 103 mg/dL
Hgb A1c MFr Bld: 5.2 % (ref 4.8–5.6)

## 2023-09-05 MED ORDER — ATORVASTATIN CALCIUM 10 MG PO TABS: 10.0000 mg | ORAL_TABLET | Freq: Every day | ORAL | 1 refills | Status: DC

## 2023-09-05 NOTE — Progress Notes (Signed)
 If patient agrees please place atorvastatin  10 mg once daily by mouth at bedtime, #30, 1 refill

## 2023-11-30 ENCOUNTER — Other Ambulatory Visit: Payer: Self-pay | Admitting: Physician Assistant

## 2023-12-27 ENCOUNTER — Other Ambulatory Visit: Payer: Self-pay | Admitting: Physician Assistant

## 2023-12-27 DIAGNOSIS — N529 Male erectile dysfunction, unspecified: Secondary | ICD-10-CM

## 2024-02-04 ENCOUNTER — Other Ambulatory Visit: Payer: Self-pay | Admitting: Physician Assistant

## 2024-03-17 ENCOUNTER — Other Ambulatory Visit: Payer: Self-pay | Admitting: Physician Assistant

## 2024-03-17 DIAGNOSIS — I10 Essential (primary) hypertension: Secondary | ICD-10-CM
